# Patient Record
Sex: Female | Born: 1950 | Race: White | Hispanic: No | Smoking: Current every day smoker
Health system: Southern US, Community
[De-identification: ages and names within clinical notes are randomized; demographics above are authoritative.]

## PROBLEM LIST (undated history)

## (undated) DIAGNOSIS — I4891 Unspecified atrial fibrillation: Secondary | ICD-10-CM

## (undated) DIAGNOSIS — E119 Type 2 diabetes mellitus without complications: Secondary | ICD-10-CM

## (undated) DIAGNOSIS — I10 Essential (primary) hypertension: Secondary | ICD-10-CM

## (undated) DIAGNOSIS — I639 Cerebral infarction, unspecified: Secondary | ICD-10-CM

## (undated) DIAGNOSIS — C801 Malignant (primary) neoplasm, unspecified: Secondary | ICD-10-CM

## (undated) DIAGNOSIS — Z853 Personal history of malignant neoplasm of breast: Secondary | ICD-10-CM

## (undated) DIAGNOSIS — E785 Hyperlipidemia, unspecified: Secondary | ICD-10-CM

## (undated) HISTORY — DX: Essential (primary) hypertension: I10

## (undated) HISTORY — DX: Type 2 diabetes mellitus without complications: E11.9

## (undated) HISTORY — PX: BREAST SURGERY: SHX581

## (undated) HISTORY — DX: Unspecified atrial fibrillation: I48.91

## (undated) HISTORY — DX: Cerebral infarction, unspecified: I63.9

## (undated) HISTORY — DX: Personal history of malignant neoplasm of breast: Z85.3

## (undated) HISTORY — DX: Hyperlipidemia, unspecified: E78.5

## (undated) HISTORY — PX: SMALL INTESTINE SURGERY: SHX150

## (undated) HISTORY — DX: Malignant (primary) neoplasm, unspecified: C80.1

## (undated) HISTORY — PX: HERNIA REPAIR: SHX51

---

## 2017-03-09 DIAGNOSIS — I10 Essential (primary) hypertension: Secondary | ICD-10-CM | POA: Insufficient documentation

## 2017-03-09 DIAGNOSIS — E119 Type 2 diabetes mellitus without complications: Secondary | ICD-10-CM | POA: Insufficient documentation

## 2017-03-09 DIAGNOSIS — I629 Nontraumatic intracranial hemorrhage, unspecified: Secondary | ICD-10-CM | POA: Insufficient documentation

## 2019-09-19 LAB — HM MAMMOGRAPHY

## 2019-11-19 DIAGNOSIS — E860 Dehydration: Secondary | ICD-10-CM | POA: Insufficient documentation

## 2019-12-11 LAB — HEMOGLOBIN A1C: Hemoglobin A1C: 5.7

## 2019-12-12 DIAGNOSIS — I480 Paroxysmal atrial fibrillation: Secondary | ICD-10-CM | POA: Insufficient documentation

## 2020-01-06 LAB — HM DEXA SCAN: HM Dexa Scan: NORMAL

## 2020-02-10 HISTORY — PX: BILATERAL TOTAL MASTECTOMY WITH AXILLARY LYMPH NODE DISSECTION: SHX6364

## 2020-03-04 DIAGNOSIS — Z8616 Personal history of COVID-19: Secondary | ICD-10-CM | POA: Insufficient documentation

## 2020-03-12 DIAGNOSIS — Z9013 Acquired absence of bilateral breasts and nipples: Secondary | ICD-10-CM | POA: Insufficient documentation

## 2020-03-30 LAB — BASIC METABOLIC PANEL
BUN: 14 (ref 4–21)
CO2: 28 — AB (ref 13–22)
Chloride: 105 (ref 99–108)
Creatinine: 0.8 (ref 0.5–1.1)
Glucose: 153
Potassium: 4.1 (ref 3.4–5.3)
Sodium: 141 (ref 137–147)

## 2020-03-30 LAB — HEPATIC FUNCTION PANEL
ALT: 14 (ref 7–35)
AST: 15 (ref 13–35)
Alkaline Phosphatase: 69 (ref 25–125)
Bilirubin, Total: 0.2

## 2020-03-30 LAB — CBC AND DIFFERENTIAL
HCT: 35 — AB (ref 36–46)
Hemoglobin: 12.2 (ref 12.0–16.0)
Neutrophils Absolute: 3.6
Platelets: 223 (ref 150–399)
WBC: 5.6

## 2020-03-30 LAB — COMPREHENSIVE METABOLIC PANEL
Albumin: 3.8 (ref 3.5–5.0)
Calcium: 9.3 (ref 8.7–10.7)

## 2020-03-30 LAB — CBC: RBC: 3.81 — AB (ref 3.87–5.11)

## 2020-04-02 ENCOUNTER — Other Ambulatory Visit: Payer: Self-pay

## 2020-04-02 ENCOUNTER — Encounter: Payer: Self-pay | Admitting: Family Medicine

## 2020-04-02 ENCOUNTER — Ambulatory Visit (INDEPENDENT_AMBULATORY_CARE_PROVIDER_SITE_OTHER): Payer: Medicare Other | Admitting: Family Medicine

## 2020-04-02 DIAGNOSIS — C50919 Malignant neoplasm of unspecified site of unspecified female breast: Secondary | ICD-10-CM

## 2020-04-02 DIAGNOSIS — J40 Bronchitis, not specified as acute or chronic: Secondary | ICD-10-CM

## 2020-04-02 DIAGNOSIS — E119 Type 2 diabetes mellitus without complications: Secondary | ICD-10-CM | POA: Diagnosis not present

## 2020-04-02 DIAGNOSIS — I10 Essential (primary) hypertension: Secondary | ICD-10-CM | POA: Diagnosis not present

## 2020-04-02 DIAGNOSIS — E785 Hyperlipidemia, unspecified: Secondary | ICD-10-CM

## 2020-04-02 DIAGNOSIS — I480 Paroxysmal atrial fibrillation: Secondary | ICD-10-CM | POA: Diagnosis not present

## 2020-04-02 DIAGNOSIS — E1169 Type 2 diabetes mellitus with other specified complication: Secondary | ICD-10-CM

## 2020-04-02 LAB — PROTEIN, TOTAL: Total Protein: 6.4 (ref 6.4–8.2)

## 2020-04-02 MED ORDER — AZITHROMYCIN 250 MG PO TABS: ORAL_TABLET | ORAL | 0 refills | Status: DC

## 2020-04-02 MED ORDER — LISINOPRIL 10 MG PO TABS: 10.0000 mg | ORAL_TABLET | Freq: Every day | ORAL | 2 refills | Status: DC

## 2020-04-02 NOTE — Patient Instructions (Signed)
Great to see you today! Go ahead and start antibiotic.  See me again in 3 months.

## 2020-04-04 DIAGNOSIS — J40 Bronchitis, not specified as acute or chronic: Secondary | ICD-10-CM | POA: Insufficient documentation

## 2020-04-04 DIAGNOSIS — C50919 Malignant neoplasm of unspecified site of unspecified female breast: Secondary | ICD-10-CM | POA: Insufficient documentation

## 2020-04-04 DIAGNOSIS — E1169 Type 2 diabetes mellitus with other specified complication: Secondary | ICD-10-CM | POA: Insufficient documentation

## 2020-04-04 DIAGNOSIS — J209 Acute bronchitis, unspecified: Secondary | ICD-10-CM | POA: Insufficient documentation

## 2020-04-04 NOTE — Assessment & Plan Note (Addendum)
BP is well controlled at this time, continue lisinopril at current strength. Recommend low sodium diet as well.

## 2020-04-04 NOTE — Progress Notes (Signed)
Jasmine Montgomery - 70 y.o. female MRN [id number]  Date of birth: 11-02-1950  Subjective Chief Complaint  Patient presents with  . Establish Care    HPI Jasmine Montgomery is 70 y.o. female here today for initial visit to establish care.  She has a history of HTN, PAF, T2DM, HLD and breast cancer.    HTN is currently managed with lisinopril 10mg .  She is doing well with this and denies side effects related to this.  She has not had chest pain, shortness of breath, palpitations, headache or vision changes.   She did have brief episode of a. Fib when just prior to having port placed.  She has not had any further episodes or symptoms related to this.  She is seeing cardiology through [facility]  Diabetes is managed with metformin 1000mg  daily.  She is doing well with this.  A1c in 12/21 wast 5.7%.  She is taking simvastatin as well and tolerating well.    She is receiving treatment for breast cancer through oncologist at [facility].  She is s/p bilateral mastectomy with neoadjuvant chemotherapy prior to this.   She has had some chest congestion with some sinus congestion.  Productive cough without shortness of breath. Symptoms started about 1 week go.   ROS:  A comprehensive ROS was completed and negative except as noted per HPI    Past Medical History:  Diagnosis Date  . A-fib ([place])   . Diabetes mellitus without complication ([place])   . History of breast cancer   . Hyperlipidemia   . Hypertension   . Stroke [facility])     History reviewed. No pertinent surgical history.  Social History   Socioeconomic History  . Marital status: Widowed    Spouse name: Not on file  . Number of children: Not on file  . Years of education: Not on file  . Highest education level: Not on file  Occupational History  . Occupation: Retired  Tobacco Use  . Smoking status: Current Every Day Smoker    Packs/day: 0.50    Types: Cigarettes  . Smokeless tobacco: Never Used  Vaping Use  . Vaping Use: Never  used  Substance and Sexual Activity  . Alcohol use: Never  . Drug use: Never  . Sexual activity: Never    Partners: Female, Female  Other Topics Concern  . Not on file  Social History Narrative  . Not on file   Social Determinants of Health   Financial Resource Strain: Not on file  Food Insecurity: Not on file  Transportation Needs: Not on file  Physical Activity: Not on file  Stress: Not on file  Social Connections: Not on file    Family History  Problem Relation Age of Onset  . Breast cancer Sister   . Diabetes Brother     Health Maintenance  Topic Date Due  . Hepatitis C Screening  Never done  . FOOT EXAM  Never done  . OPHTHALMOLOGY EXAM  Never done  . COVID-19 Vaccine (3 - Pfizer risk 4-dose series) 11/26/2019  . INFLUENZA VACCINE  09/09/2020 (Originally 08/10/2019)  . COLONOSCOPY (Pts 45-25yrs Insurance coverage will need to be confirmed)  04/02/2021 (Originally 04/05/1997)  . PNA vac Low Risk Adult (1 of 2 - PCV13) 04/02/2021 (Originally 04/05/2017)  . HEMOGLOBIN A1C  06/10/2020  . MAMMOGRAM  03/31/2022  . TETANUS/TDAP  09/30/2025  . DEXA SCAN  Completed  . HPV VACCINES  Aged Out     ----------------------------------------------------------------------------------------------------------------------------------------------------------------------------------------------------------------- Physical Exam BP (!) 131/53 (  BP Location: Left Arm, Patient Position: Sitting, Cuff Size: Large)   Pulse 83   Temp 97.6 F (36.4 C)   Ht 5' 3.98" (1.625 m)   Wt 200 lb 14.4 oz (91.1 kg)   SpO2 96%   BMI 34.51 kg/m   Physical Exam Constitutional:      Appearance: Normal appearance.  HENT:     Head: Normocephalic and atraumatic.  Eyes:     General: No scleral icterus. Cardiovascular:     Rate and Rhythm: Normal rate and regular rhythm.  Pulmonary:     Effort: Pulmonary effort is normal.     Breath sounds: Normal breath sounds.  Musculoskeletal:     Cervical back:  Neck supple.  Neurological:     General: No focal deficit present.     Mental Status: She is alert.  Psychiatric:        Mood and Affect: Mood normal.        Behavior: Behavior normal.     ------------------------------------------------------------------------------------------------------------------------------------------------------------------------------------------------------------------- Assessment and Plan  Essential hypertension BP is well controlled at this time, continue lisinopril at current strength. Recommend low sodium diet as well.   Paroxysmal atrial fibrillation (HCC) Solitary episode prior to having port placed.  No symptoms and no further episodes since.  She will continue to see cardiology through [facility]    Type 2 diabetes mellitus without complication, without long-term current use of insulin ([place]) Diabetes is well controlled with metformin.  We'll plan to recheck this again at next f/u.    Breast cancer [facility]) S/p neoadjuvant chemo with recent mastectomy.  Healing well and will continue care through [facility].   Bronchitis Symptoms present for a week without improvement.  Will add azithromycin, zpack dosing.   Hyperlipidemia associated with type 2 diabetes mellitus ([place]) Tolerating simvastatin well.  She will continue and we'll update lipid panel at next follow up.     Meds ordered this encounter  Medications  . azithromycin (ZITHROMAX) 250 MG tablet    Sig: Take 2 tabs on day 1 followed by 1 tab on days 2-5    Dispense:  6 tablet    Refill:  0  . lisinopril (ZESTRIL) 10 MG tablet    Sig: Take 1 tablet (10 mg total) by mouth daily.    Dispense:  90 tablet    Refill:  2    Return in about 3 months (around 07/03/2020) for HTN/T2DM.    This visit occurred during the SARS-CoV-2 public health emergency.  Safety protocols were in place, including screening questions prior to the visit, additional usage of staff PPE, and extensive cleaning of  exam room while observing appropriate contact time as indicated for disinfecting solutions.

## 2020-04-04 NOTE — Assessment & Plan Note (Signed)
Tolerating simvastatin well.  She will continue and we'll update lipid panel at next follow up.

## 2020-04-04 NOTE — Assessment & Plan Note (Signed)
Solitary episode prior to having port placed.  No symptoms and no further episodes since.  She will continue to see cardiology through [facility].

## 2020-04-04 NOTE — Assessment & Plan Note (Signed)
Symptoms present for a week without improvement.  Will add azithromycin, zpack dosing.

## 2020-04-04 NOTE — Assessment & Plan Note (Signed)
S/p neoadjuvant chemo with recent mastectomy.  Healing well and will continue care through [facility].

## 2020-04-04 NOTE — Assessment & Plan Note (Signed)
Diabetes is well controlled with metformin.  We'll plan to recheck this again at next f/u.

## 2020-04-06 ENCOUNTER — Other Ambulatory Visit: Payer: Self-pay

## 2020-04-06 MED ORDER — SIMVASTATIN 20 MG PO TABS
20.0000 mg | ORAL_TABLET | Freq: Every day | ORAL | 1 refills | Status: DC
Start: 2020-04-06 — End: 2020-10-14

## 2020-05-05 ENCOUNTER — Telehealth: Payer: Self-pay

## 2020-05-05 NOTE — Telephone Encounter (Signed)
Transition Care Management Follow-up Telephone Call  Date of discharge and from where: 05/04/2020 from [facility]  How have you been since you were released from the hospital? Pt stated that she is feeling so much better today.   Any questions or concerns? No  Items Reviewed:  Did the pt receive and understand the discharge instructions provided? Yes   Medications obtained and verified? Yes   Other? No   Any new allergies since your discharge? No   Dietary orders reviewed? n/a  Do you have support at home? Yes   Functional Questionnaire: (I = Independent and D = Dependent) ADLs: I  Bathing/Dressing- I  Meal Prep- I  Eating- I  Maintaining continence- I  Transferring/Ambulation- I  Managing Meds- I   Follow up appointments reviewed:   PCP Hospital f/u appt confirmed? No    Specialist Hospital f/u appt confirmed? No    Are transportation arrangements needed? No   If their condition worsens, is the pt aware to call PCP or go to the Emergency Dept.? Yes  Was the patient provided with contact information for the PCP's office or ED? Yes  Was to pt encouraged to call back with questions or concerns? Yes

## 2020-06-09 ENCOUNTER — Other Ambulatory Visit: Payer: Self-pay | Admitting: *Deleted

## 2020-06-09 MED ORDER — ALBUTEROL SULFATE HFA 108 (90 BASE) MCG/ACT IN AERS: 1.0000 | INHALATION_SPRAY | RESPIRATORY_TRACT | 2 refills | Status: DC | PRN

## 2020-07-05 ENCOUNTER — Encounter: Payer: Self-pay | Admitting: Family Medicine

## 2020-07-05 ENCOUNTER — Ambulatory Visit (INDEPENDENT_AMBULATORY_CARE_PROVIDER_SITE_OTHER): Payer: Medicare Other | Admitting: Family Medicine

## 2020-07-05 ENCOUNTER — Other Ambulatory Visit: Payer: Self-pay

## 2020-07-05 ENCOUNTER — Ambulatory Visit (INDEPENDENT_AMBULATORY_CARE_PROVIDER_SITE_OTHER): Payer: Medicare Other

## 2020-07-05 VITALS — BP 142/59 | HR 103 | Temp 97.7°F | Wt 195.0 lb

## 2020-07-05 DIAGNOSIS — I10 Essential (primary) hypertension: Secondary | ICD-10-CM

## 2020-07-05 DIAGNOSIS — K5909 Other constipation: Secondary | ICD-10-CM

## 2020-07-05 DIAGNOSIS — E119 Type 2 diabetes mellitus without complications: Secondary | ICD-10-CM

## 2020-07-05 DIAGNOSIS — R109 Unspecified abdominal pain: Secondary | ICD-10-CM | POA: Diagnosis not present

## 2020-07-05 DIAGNOSIS — E1169 Type 2 diabetes mellitus with other specified complication: Secondary | ICD-10-CM

## 2020-07-05 DIAGNOSIS — E785 Hyperlipidemia, unspecified: Secondary | ICD-10-CM

## 2020-07-05 DIAGNOSIS — K5900 Constipation, unspecified: Secondary | ICD-10-CM | POA: Insufficient documentation

## 2020-07-05 LAB — POCT GLYCOSYLATED HEMOGLOBIN (HGB A1C): HbA1c, POC (controlled diabetic range): 6.3 % (ref 0.0–7.0)

## 2020-07-05 NOTE — Assessment & Plan Note (Signed)
Tolerating simvastatin well, continue at current strength

## 2020-07-05 NOTE — Assessment & Plan Note (Signed)
Blood pressure is at goal at for age and co-morbidities.  I recommend continuation of lisinopril.  In addition they were instructed to follow a low sodium diet with regular exercise to help to maintain adequate control of blood pressure.

## 2020-07-05 NOTE — Assessment & Plan Note (Signed)
Most recent A1c of  Lab Results  Component Value Date   HGBA1C 6.3 07/05/2020   indicates diabetes is well controlled.  she will continue metformin at current strength.  Counseled on healthy, low carb diet and recommend frequent activity to help with maintaining good control of blood sugars.

## 2020-07-05 NOTE — Assessment & Plan Note (Signed)
Recommend increased fluid intake with addition of miralax or fiber suppleent.  KUB ordered as well to evaluate for possible obstruction.

## 2020-07-05 NOTE — Patient Instructions (Signed)
Start miralax daily 1-2 capfuls daily as needed for soft bowel movement.  Be sure to drink plenty of water.  Have xray completed downstairs.

## 2020-07-05 NOTE — Progress Notes (Signed)
Jasmine Montgomery - 70 y.o. female MRN [id number]  Date of birth: 1950/02/02  Subjective Chief Complaint  Patient presents with   Diabetes   Hypertension    HPI Jasmine Montgomery is a 70 y.o. female here today for follow up visit.    She continues with chemotherapy for treatment of her breast cancer.  She was also recently started on letrozole.  She has had some increased constipation recently.  She has tried dulcolax as needed.   She does have some mid abdominal pain.  She denies nausea or decreased appetite.   She continues on metformin for treatment of diabetes.  She does not check blood sugar at home.  She denies symptoms related to diabetes. She is tolerating simvastatin well for associated HLD  HTN remains well controlled with lisinopril.  She denies chest pain,shortness of breath,palpitations, headache or vision changes.   ROS:  A comprehensive ROS was completed and negative except as noted per HPI  No Known Allergies  Past Medical History:  Diagnosis Date   A-fib ([place])    Diabetes mellitus without complication ([place])    History of breast cancer    Hyperlipidemia    Hypertension    Stroke [facility])     History reviewed. No pertinent surgical history.  Social History   Socioeconomic History   Marital status: Widowed    Spouse name: Not on file   Number of children: Not on file   Years of education: Not on file   Highest education level: Not on file  Occupational History   Occupation: Retired  Tobacco Use   Smoking status: Every Day    Packs/day: 0.50    Pack years: 0.00    Types: Cigarettes   Smokeless tobacco: Never  Vaping Use   Vaping Use: Never used  Substance and Sexual Activity   Alcohol use: Never   Drug use: Never   Sexual activity: Never    Partners: Female, Female  Other Topics Concern   Not on file  Social History Narrative   Not on file   Social Determinants of Health   Financial Resource Strain: Not on file  Food Insecurity: Not on file   Transportation Needs: Not on file  Physical Activity: Not on file  Stress: Not on file  Social Connections: Not on file    Family History  Problem Relation Age of Onset   Breast cancer Sister    Diabetes Brother     Health Maintenance  Topic Date Due   FOOT EXAM  Never done   OPHTHALMOLOGY EXAM  Never done   URINE MICROALBUMIN  Never done   Hepatitis C Screening  Never done   Zoster Vaccines- Shingrix (1 of 2) Never done   COVID-19 Vaccine (3 - Pfizer risk series) 11/26/2019   COLONOSCOPY (Pts 45-67yrs Insurance coverage will need to be confirmed)  04/02/2021 (Originally 04/05/1997)   PNA vac Low Risk Adult (1 of 2 - PCV13) 04/02/2021 (Originally 04/05/2017)   INFLUENZA VACCINE  08/09/2020   HEMOGLOBIN A1C  01/04/2021   MAMMOGRAM  09/18/2021   TETANUS/TDAP  09/30/2025   DEXA SCAN  Completed   HPV VACCINES  Aged Out     ----------------------------------------------------------------------------------------------------------------------------------------------------------------------------------------------------------------- Physical Exam BP (!) 142/59 (BP Location: Left Arm, Patient Position: Sitting, Cuff Size: Large)   Pulse (!) 103   Temp 97.7 F (36.5 C)   Wt 195 lb (88.5 kg)   SpO2 97%   BMI 33.50 kg/m   Physical Exam Constitutional:      Appearance:  Normal appearance.  HENT:     Head: Normocephalic and atraumatic.  Eyes:     General: No scleral icterus. Cardiovascular:     Rate and Rhythm: Normal rate and regular rhythm.  Pulmonary:     Effort: Pulmonary effort is normal.     Breath sounds: Normal breath sounds.  Abdominal:     Comments: Mild distention with ttp around the mid abdomen.  Musculoskeletal:     Cervical back: Neck supple.  Skin:    General: Skin is warm and dry.  Neurological:     General: No focal deficit present.     Mental Status: She is alert.     ------------------------------------------------------------------------------------------------------------------------------------------------------------------------------------------------------------------- Assessment and Plan  Type 2 diabetes mellitus without complication, without long-term current use of insulin ([place]) Most recent A1c of  Lab Results  Component Value Date   HGBA1C 6.3 07/05/2020   indicates diabetes is well controlled.  she will continue metformin at current strength.  Counseled on healthy, low carb diet and recommend frequent activity to help with maintaining good control of blood sugars.    Hyperlipidemia associated with type 2 diabetes mellitus ([place]) Tolerating simvastatin well, continue at current strength  Essential hypertension Blood pressure is at goal at for age and co-morbidities.  I recommend continuation of lisinopril.  In addition they were instructed to follow a low sodium diet with regular exercise to help to maintain adequate control of blood pressure.    Constipation Recommend increased fluid intake with addition of miralax or fiber suppleent.  KUB ordered as well to evaluate for possible obstruction.     No orders of the defined types were placed in this encounter.   No follow-ups on file.    This visit occurred during the SARS-CoV-2 public health emergency.  Safety protocols were in place, including screening questions prior to the visit, additional usage of staff PPE, and extensive cleaning of exam room while observing appropriate contact time as indicated for disinfecting solutions.

## 2020-08-25 ENCOUNTER — Emergency Department (INDEPENDENT_AMBULATORY_CARE_PROVIDER_SITE_OTHER)
Admission: RE | Admit: 2020-08-25 | Discharge: 2020-08-25 | Disposition: A | Discharge status: Home / Self Care | Payer: Medicare Other | Source: Ambulatory Visit

## 2020-08-25 ENCOUNTER — Telehealth: Payer: Self-pay

## 2020-08-25 ENCOUNTER — Other Ambulatory Visit: Payer: Self-pay

## 2020-08-25 VITALS — BP 122/81 | HR 100 | Temp 99.0°F | Resp 16 | Ht 65.5 in | Wt 194.0 lb

## 2020-08-25 DIAGNOSIS — K5900 Constipation, unspecified: Secondary | ICD-10-CM

## 2020-08-25 MED ORDER — MAGNESIUM CITRATE PO SOLN: ORAL | 1 refills | Status: DC

## 2020-08-25 NOTE — Discharge Instructions (Addendum)
Advised patient to take medication as directed to completion.  Encouraged patient to increase daily water intake while taking this medication.

## 2020-08-25 NOTE — Telephone Encounter (Signed)
Pt called and stated Mag citrate was not available at her pharmacy, alternate pharmacy contacted by this nurse and Mag Citrate continues to be on back order. Eliezer Lofts FNP notified and recommended pt be evaluated in the ED for symptoms. Pt verbalized understanding.

## 2020-08-25 NOTE — ED Provider Notes (Signed)
[facility]    CSN: [id number] Arrival date & time: 08/25/20  1402      History   Chief Complaint Chief Complaint  Patient presents with   Constipation    HPI Jasmine Montgomery is a 70 y.o. female.   HPI Pleasant 70 year old female presents with constipation and reports not having a bowel movement since Saturday, 08/21/2020.  Reports is recently have a change in her chemo (s/p breast cancer, s/p bilateral mastectomy) recently is coming up on her seventh chemo treatment out of 14 and will switch over to oral chemo after 14th treatment.  Patient reports has been taking Zofran for nausea.  Past Medical History:  Diagnosis Date   A-fib [facility])    Diabetes mellitus without complication ([place])    History of breast cancer    Hyperlipidemia    Hypertension    Stroke [facility])     Patient Active Problem List   Diagnosis Date Noted   Constipation 07/05/2020   Breast cancer ([place]) 04/04/2020   Bronchitis 04/04/2020   Hyperlipidemia associated with type 2 diabetes mellitus ([place]) 04/04/2020   S/P bilateral mastectomy 03/12/2020   History of COVID-19 03/04/2020   Paroxysmal atrial fibrillation ([place]) 12/12/2019   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 No pertinent surgical history.  OB History   No obstetric history on file.      Home Medications    Prior to Admission medications   Medication Sig Start Date End Date Taking? Authorizing Provider  magnesium citrate SOLN Take 150 mL p.o. twice daily for 3 days. 08/25/20  Yes Eliezer Lofts, FNP  albuterol (VENTOLIN HFA) 108 (90 Base) MCG/ACT inhaler Inhale 1-2 puffs into the lungs every 4 (four) hours as needed for wheezing or shortness of breath. 06/09/20   Luetta Nutting, DO  Ascorbic Acid (VITAMIN C PO) Take by  mouth. Patient not taking: Reported on 08/25/2020    [provider]  Cholecalciferol 25 MCG (1000 UT) tablet Take by mouth.    [provider]  letrozole (FEMARA) 2.5 MG tablet Take 2.5 mg by mouth daily. 06/02/20   [provider]  lisinopril (ZESTRIL) 10 MG tablet Take 1 tablet (10 mg total) by mouth daily. 04/02/20 07/01/20  Luetta Nutting, DO  metFORMIN (GLUCOPHAGE) 1000 MG tablet Take by mouth. 07/18/19   [provider]  simvastatin (ZOCOR) 20 MG tablet Take 1 tablet (20 mg total) by mouth daily. 04/06/20 07/05/20  Luetta Nutting, DO  valACYclovir (VALTREX) 1000 MG tablet Take 1,000 mg by mouth 3 (three) times daily. Patient not taking: Reported on 08/25/2020 02/05/20   [provider]  VITAMIN A PO Take by mouth.    [provider]  vitamin B-12 (CYANOCOBALAMIN) 500 MCG tablet Take by mouth.    [provider]  VITAMIN E PO Take by mouth.    [provider]    Family History Family History  Problem Relation Age of Onset   Breast cancer Sister    Diabetes Brother     Social History Social History   Tobacco Use   Smoking status: Every Day    Packs/day: 0.50    Types: Cigarettes   Smokeless tobacco: Never  Vaping Use   Vaping Use: Never used  Substance Use Topics   Alcohol use: Never   Drug use: Never  Allergies   Patient has no known allergies.   Review of Systems Review of Systems  Gastrointestinal:  Positive for constipation.  All other systems reviewed and are negative.   Physical Exam Triage Vital Signs ED Triage Vitals  Enc Vitals Group     BP 08/25/20 1428 122/81     Pulse Rate 08/25/20 1428 100     Resp 08/25/20 1428 16     Temp 08/25/20 1428 99 F (37.2 C)     Temp Source 08/25/20 1428 Oral     SpO2 08/25/20 1428 96 %     Weight 08/25/20 1430 194 lb (88 kg)     Height 08/25/20 1430 5' 5.5" (1.664 m)     Head Circumference --      Peak Flow --      Pain Score 08/25/20 1430 4      Pain Loc --      Pain Edu? --      Excl. in [place]? --    No data found.  Updated Vital Signs BP 122/81   Pulse 100   Temp 99 F (37.2 C) (Oral)   Resp 16   Ht 5' 5.5" (1.664 m)   Wt 194 lb (88 kg)   SpO2 96%   BMI 31.79 kg/m     Physical Exam Vitals and nursing note reviewed.  Constitutional:      General: She is not in acute distress.    Appearance: Normal appearance. She is obese. She is ill-appearing. She is not toxic-appearing or diaphoretic.  HENT:     Head: Normocephalic and atraumatic.     Nose: Nose normal.     Mouth/Throat:     Mouth: Mucous membranes are moist.     Pharynx: Oropharynx is clear.  Eyes:     Extraocular Movements: Extraocular movements intact.     Conjunctiva/sclera: Conjunctivae normal.     Pupils: Pupils are equal, round, and reactive to light.  Cardiovascular:     Rate and Rhythm: Normal rate and regular rhythm.     Pulses: Normal pulses.     Heart sounds: Normal heart sounds. No murmur heard.   No friction rub. No gallop.  Pulmonary:     Effort: Pulmonary effort is normal.     Breath sounds: Normal breath sounds. No stridor. No wheezing, rhonchi or rales.  Abdominal:     General: There is distension.     Palpations: Abdomen is soft. There is no mass.     Tenderness: There is abdominal tenderness. There is no right CVA tenderness, left CVA tenderness or guarding.     Hernia: No hernia is present.     Comments: Hypoactive bowel sounds x4 quadrants, TTP over LLQ/RLQ, no hepatosplenomegaly  Musculoskeletal:        General: Normal range of motion.     Cervical back: Normal range of motion and neck supple. No tenderness.  Lymphadenopathy:     Cervical: No cervical adenopathy.  Skin:    General: Skin is warm and dry.  Neurological:     General: No focal deficit present.     Mental Status: She is alert and oriented to person, place, and time. Mental status is at baseline.  Psychiatric:        Mood and Affect: Mood normal.        Behavior:  Behavior normal.        Thought Content: Thought content normal.     UC Treatments / Results  Labs (all labs ordered are  listed, but only abnormal results are displayed) Labs Reviewed - No data to display  EKG   Radiology No results found.  Procedures Procedures (including critical care time)  Medications Ordered in UC Medications - No data to display  Initial Impression / Assessment and Plan / UC Course  I have reviewed the triage vital signs and the nursing notes.  Pertinent labs & imaging results that were available during my care of the patient were reviewed by me and considered in my medical decision making (see chart for details).    MDM: 1.  Constipation-Rx'd Mag citrate. Advised patient to take medication as directed to completion.  Encouraged patient to increase daily water intake while taking this medication.  Patient discharged home, hemodynamically stable. Final Clinical Impressions(s) / UC Diagnoses   Final diagnoses:  Constipation, unspecified constipation type     Discharge Instructions      Advised patient to take medication as directed to completion.  Encouraged patient to increase daily water intake while taking this medication.      ED Prescriptions     Medication Sig Dispense Auth. Provider   magnesium citrate SOLN Take 150 mL p.o. twice daily for 3 days. 900 mL Eliezer Lofts, FNP      PDMP not reviewed this encounter.   Eliezer Lofts, [place] 08/25/20 1450

## 2020-08-25 NOTE — ED Triage Notes (Signed)
T presents with constipation associated with a change in her chemo medication. Pt states she has been unable to eat since Saturday and beginning Sunday has had began with abdominal pain and bloating. Pt has been taking miralax without any change in condition. Pt states she is also having emesis and is taking Zofran as needed.

## 2020-09-23 ENCOUNTER — Encounter: Payer: Self-pay | Admitting: Family Medicine

## 2020-09-23 ENCOUNTER — Ambulatory Visit (INDEPENDENT_AMBULATORY_CARE_PROVIDER_SITE_OTHER): Payer: Medicare Other | Admitting: Family Medicine

## 2020-09-23 VITALS — BP 139/58 | HR 94 | Temp 98.1°F | Ht 65.5 in | Wt 196.1 lb

## 2020-09-23 DIAGNOSIS — K5900 Constipation, unspecified: Secondary | ICD-10-CM

## 2020-09-23 DIAGNOSIS — R109 Unspecified abdominal pain: Secondary | ICD-10-CM

## 2020-09-23 DIAGNOSIS — Z23 Encounter for immunization: Secondary | ICD-10-CM

## 2020-09-23 MED ORDER — CLOTRIMAZOLE-BETAMETHASONE 1-0.05 % EX CREA: 1.0000 "application " | TOPICAL_CREAM | Freq: Two times a day (BID) | CUTANEOUS | 1 refills | Status: DC

## 2020-09-23 NOTE — Assessment & Plan Note (Signed)
She continues to have problems with constipation however current bowel regimen is working pretty well for her.  She will continue this.  She is having some increased pain with bloating.  She does have history of complications related to hernia mesh.  We discussed getting an abdominal ultrasound for initial evaluation.  We can consider CT scan if this is inconclusive.

## 2020-09-23 NOTE — Progress Notes (Signed)
Jasmine Montgomery - 70 y.o. female MRN [id number]  Date of birth: 21-Apr-1950  Subjective Chief Complaint  Patient presents with   Abdominal Pain    HPI Jasmine Montgomery is a 70 year old female here today with complaint of abdominal pain.  She also has questions about disability process.  She has had pain in her abdomen.  She has had this for a couple weeks.  She has had associated bloating sensation.  She has had some nausea and constipation at times.  She is on a fairly good bowel regimen to help with her constipation.  This seems to worsen after femara.  She did have previous problems with mesh related to hernia repair and had partial small bowel resection.  She and her daughter have questions regarding possible disability.  Recommended that she look into disability through Social Security if interested in starting process for this.  ROS:  A comprehensive ROS was completed and negative except as noted per HPI  No Known Allergies  Past Medical History:  Diagnosis Date   A-fib ([place])    Diabetes mellitus without complication ([place])    History of breast cancer    Hyperlipidemia    Hypertension    Stroke ([place])     No past surgical history on file.  Social History   Socioeconomic History   Marital status: Widowed    Spouse name: Not on file   Number of children: Not on file   Years of education: Not on file   Highest education level: Not on file  Occupational History   Occupation: Retired  Tobacco Use   Smoking status: Every Day    Packs/day: 0.50    Types: Cigarettes   Smokeless tobacco: Never  Vaping Use   Vaping Use: Never used  Substance and Sexual Activity   Alcohol use: Never   Drug use: Never   Sexual activity: Never    Partners: Female, Female  Other Topics Concern   Not on file  Social History Narrative   Not on file   Social Determinants of Health   Financial Resource Strain: Not on file  Food Insecurity: Not on file  Transportation Needs: Not on file   Physical Activity: Not on file  Stress: Not on file  Social Connections: Not on file    Family History  Problem Relation Age of Onset   Breast cancer Sister    Diabetes Brother     Health Maintenance  Topic Date Due   FOOT EXAM  Never done   OPHTHALMOLOGY EXAM  Never done   URINE MICROALBUMIN  Never done   Hepatitis C Screening  Never done   COVID-19 Vaccine (3 - Pfizer risk series) 11/26/2019   COLONOSCOPY (Pts 45-74yr Insurance coverage will need to be confirmed)  04/02/2021 (Originally 04/05/1997)   PNA vac Low Risk Adult (1 of 2 - PCV13) 04/02/2021 (Originally 04/05/2017)   Zoster Vaccines- Shingrix (2 of 2) 11/18/2020   HEMOGLOBIN A1C  01/04/2021   MAMMOGRAM  09/18/2021   TETANUS/TDAP  09/30/2025   DEXA SCAN  Completed   HPV VACCINES  Aged Out   INFLUENZA VACCINE  Discontinued     ----------------------------------------------------------------------------------------------------------------------------------------------------------------------------------------------------------------- Physical Exam BP (!) 139/58 (BP Location: Left Arm, Patient Position: Sitting, Cuff Size: Large)   Pulse 94   Temp 98.1 F (36.7 C)   Ht 5' 5.5" (1.664 m)   Wt 196 lb 1.6 oz (89 kg)   SpO2 96%   BMI 32.14 kg/m   Physical Exam Constitutional:  Appearance: She is well-developed.  HENT:     Head: Normocephalic and atraumatic.  Eyes:     General: No scleral icterus. Cardiovascular:     Rate and Rhythm: Normal rate and regular rhythm.  Pulmonary:     Effort: Pulmonary effort is normal.     Breath sounds: Normal breath sounds.  Abdominal:     General: There is no distension.     Palpations: Abdomen is soft.     Tenderness: There is no abdominal tenderness.  Musculoskeletal:     Cervical back: Neck supple.  Neurological:     Mental Status: She is alert.  Psychiatric:        Mood and Affect: Mood normal.        Behavior: Behavior normal.     ------------------------------------------------------------------------------------------------------------------------------------------------------------------------------------------------------------------- Assessment and Plan  Constipation She continues to have problems with constipation however current bowel regimen is working pretty well for her.  She will continue this.  She is having some increased pain with bloating.  She does have history of complications related to hernia mesh.  We discussed getting an abdominal ultrasound for initial evaluation.  We can consider CT scan if this is inconclusive.     Meds ordered this encounter  Medications   clotrimazole-betamethasone (LOTRISONE) cream    Sig: Apply 1 application topically 2 (two) times daily.    Dispense:  30 g    Refill:  1    No follow-ups on file.    This visit occurred during the SARS-CoV-2 public health emergency.  Safety protocols were in place, including screening questions prior to the visit, additional usage of staff PPE, and extensive cleaning of exam room while observing appropriate contact time as indicated for disinfecting solutions.

## 2020-09-23 NOTE — Patient Instructions (Signed)
Try adding align pro-biotic.  We'll be in touch with [place] results.

## 2020-09-24 ENCOUNTER — Other Ambulatory Visit: Payer: Medicare Other

## 2020-09-27 ENCOUNTER — Other Ambulatory Visit: Payer: Self-pay

## 2020-09-27 ENCOUNTER — Ambulatory Visit (INDEPENDENT_AMBULATORY_CARE_PROVIDER_SITE_OTHER): Payer: Medicare Other

## 2020-09-27 DIAGNOSIS — R109 Unspecified abdominal pain: Secondary | ICD-10-CM | POA: Diagnosis not present

## 2020-10-01 ENCOUNTER — Other Ambulatory Visit: Payer: Self-pay | Admitting: Family Medicine

## 2020-10-01 DIAGNOSIS — R161 Splenomegaly, not elsewhere classified: Secondary | ICD-10-CM

## 2020-10-01 DIAGNOSIS — R109 Unspecified abdominal pain: Secondary | ICD-10-CM

## 2020-10-07 ENCOUNTER — Other Ambulatory Visit: Payer: Medicare Other

## 2020-10-11 ENCOUNTER — Other Ambulatory Visit: Payer: Self-pay

## 2020-10-11 ENCOUNTER — Ambulatory Visit (INDEPENDENT_AMBULATORY_CARE_PROVIDER_SITE_OTHER): Payer: Medicare Other

## 2020-10-11 DIAGNOSIS — R161 Splenomegaly, not elsewhere classified: Secondary | ICD-10-CM | POA: Diagnosis not present

## 2020-10-11 DIAGNOSIS — R109 Unspecified abdominal pain: Secondary | ICD-10-CM

## 2020-10-11 DIAGNOSIS — G8929 Other chronic pain: Secondary | ICD-10-CM | POA: Diagnosis not present

## 2020-10-11 MED ORDER — IOHEXOL 350 MG/ML SOLN
100.0000 mL | Freq: Once | INTRAVENOUS | Status: AC | PRN
  Administered 2020-10-11: 75 mL via INTRAVENOUS

## 2020-10-14 ENCOUNTER — Other Ambulatory Visit: Payer: Self-pay | Admitting: Family Medicine

## 2020-10-15 ENCOUNTER — Other Ambulatory Visit: Payer: Self-pay | Admitting: Family Medicine

## 2020-10-15 DIAGNOSIS — E278 Other specified disorders of adrenal gland: Secondary | ICD-10-CM

## 2020-10-15 DIAGNOSIS — D4412 Neoplasm of uncertain behavior of left adrenal gland: Secondary | ICD-10-CM

## 2020-10-18 ENCOUNTER — Ambulatory Visit (INDEPENDENT_AMBULATORY_CARE_PROVIDER_SITE_OTHER): Payer: Medicare Other

## 2020-10-18 ENCOUNTER — Other Ambulatory Visit: Payer: Self-pay | Admitting: Family Medicine

## 2020-10-18 ENCOUNTER — Other Ambulatory Visit: Payer: Self-pay

## 2020-10-18 DIAGNOSIS — E278 Other specified disorders of adrenal gland: Secondary | ICD-10-CM

## 2020-10-18 DIAGNOSIS — D4412 Neoplasm of uncertain behavior of left adrenal gland: Secondary | ICD-10-CM

## 2020-10-18 MED ORDER — IOHEXOL 350 MG/ML SOLN
100.0000 mL | Freq: Once | INTRAVENOUS | Status: AC | PRN
  Administered 2020-10-18: 82 mL via INTRAVENOUS

## 2020-11-04 ENCOUNTER — Other Ambulatory Visit: Payer: Self-pay

## 2020-11-04 DIAGNOSIS — E119 Type 2 diabetes mellitus without complications: Secondary | ICD-10-CM

## 2020-11-04 MED ORDER — METFORMIN HCL 1000 MG PO TABS: 1000.0000 mg | ORAL_TABLET | Freq: Every day | ORAL | 3 refills | Status: DC

## 2020-12-20 ENCOUNTER — Other Ambulatory Visit: Payer: Self-pay | Admitting: Family Medicine

## 2021-01-04 ENCOUNTER — Other Ambulatory Visit: Payer: Self-pay

## 2021-01-04 ENCOUNTER — Emergency Department (INDEPENDENT_AMBULATORY_CARE_PROVIDER_SITE_OTHER)
Admission: RE | Admit: 2021-01-04 | Discharge: 2021-01-04 | Disposition: A | Discharge status: Home / Self Care | Payer: Medicare Other | Source: Ambulatory Visit | Attending: Family Medicine | Admitting: Family Medicine

## 2021-01-04 VITALS — BP 118/76 | HR 95 | Temp 98.6°F | Resp 14

## 2021-01-04 DIAGNOSIS — J309 Allergic rhinitis, unspecified: Secondary | ICD-10-CM

## 2021-01-04 DIAGNOSIS — R059 Cough, unspecified: Secondary | ICD-10-CM | POA: Diagnosis not present

## 2021-01-04 MED ORDER — FEXOFENADINE HCL 180 MG PO TABS: 180.0000 mg | ORAL_TABLET | Freq: Every day | ORAL | 0 refills | Status: DC

## 2021-01-04 MED ORDER — CEFDINIR 300 MG PO CAPS: 300.0000 mg | ORAL_CAPSULE | Freq: Two times a day (BID) | ORAL | 0 refills | Status: AC

## 2021-01-04 MED ORDER — BENZONATATE 200 MG PO CAPS: 200.0000 mg | ORAL_CAPSULE | Freq: Three times a day (TID) | ORAL | 0 refills | Status: AC | PRN

## 2021-01-04 NOTE — ED Provider Notes (Signed)
[facility]    CSN: [id number] Arrival date & time: 01/04/21  1039      History   Chief Complaint Chief Complaint  Patient presents with   APPT 1100   Cough    HPI Jasmine Montgomery is a 70 y.o. female.   HPI Pleasant 70 year old female presents with cough for 2 weeks.  Reports completing last chemo session (s/p breast cancer, s/p bilateral mastectomy) on Thursday of last week (12/30/20).  PMH significant for Paroxysmal A. fib, HTN, and stroke.  Past Medical History:  Diagnosis Date   A-fib [facility])    Diabetes mellitus without complication ([place])    History of breast cancer    Hyperlipidemia    Hypertension    Stroke [facility])     Patient Active Problem List   Diagnosis Date Noted   Constipation 07/05/2020   Breast cancer ([place]) 04/04/2020   Bronchitis 04/04/2020   Hyperlipidemia associated with type 2 diabetes mellitus ([place]) 04/04/2020   S/P bilateral mastectomy 03/12/2020   History of COVID-19 03/04/2020   Paroxysmal atrial fibrillation ([place]) 12/12/2019   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 No pertinent surgical history.  OB History   No obstetric history on file.      Home Medications    Prior to Admission medications   Medication Sig Start Date End Date Taking? Authorizing Provider  benzonatate (TESSALON) 200 MG capsule Take 1 capsule (200 mg total) by mouth 3 (three) times daily as needed for up to 7 days for cough. 01/04/21 01/11/21 Yes Eliezer Lofts, FNP  cefdinir (OMNICEF) 300 MG capsule Take 1 capsule (300 mg total) by mouth 2 (two) times daily for 10 days. 01/04/21 01/14/21 Yes Eliezer Lofts, FNP  fexofenadine [facility] ALLERGY) 180 MG tablet Take 1 tablet (180 mg total) by mouth daily for 15 days. 01/04/21 01/19/21 Yes Eliezer Lofts, FNP   Ascorbic Acid (VITAMIN C PO) Take by mouth.    [provider]  Cholecalciferol 25 MCG (1000 UT) tablet Take by mouth.    [provider]  clotrimazole-betamethasone (LOTRISONE) cream Apply 1 application topically 2 (two) times daily. 09/23/20   Luetta Nutting, DO  letrozole [facility]) 2.5 MG tablet Take 2.5 mg by mouth daily. 06/02/20   [provider]  lidocaine-prilocaine (EMLA) cream Apply topically as needed. 08/18/20   [provider]  lisinopril (ZESTRIL) 10 MG tablet Take 1 tablet (10 mg total) by mouth daily. 04/02/20 07/01/20  Luetta Nutting, DO  magnesium citrate SOLN Take 150 mL p.o. twice daily for 3 days. 08/25/20   Eliezer Lofts, FNP  metFORMIN (GLUCOPHAGE) 1000 MG tablet Take 1 tablet (1,000 mg total) by mouth daily with breakfast. 11/04/20 02/02/21  Luetta Nutting, DO  ondansetron (ZOFRAN) 4 MG tablet Take 4 mg by mouth every 8 (eight) hours as needed. 08/23/20   [provider]  simvastatin (ZOCOR) 20 MG tablet Take 1 tablet by mouth once daily 10/14/20   Luetta Nutting, DO  valACYclovir (VALTREX) 1000 MG tablet Take 1,000 mg by mouth 3 (three) times daily. 02/05/20   [provider]  VENTOLIN HFA 108 (90 Base) MCG/ACT inhaler INHALE 1 TO 2 PUFFS INTO LUNGS EVERY 4 HOURS AS NEEDED FOR WHEEZING OR SHORTNESS OF BREATH 12/21/20   Luetta Nutting, DO  VITAMIN A PO Take by mouth.    [provider]  vitamin B-12 (CYANOCOBALAMIN) 500 MCG tablet Take by mouth.    [provider]  VITAMIN E PO Take by mouth.    [provider]    Family History Family History  Problem Relation Age of Onset   Breast cancer Sister    Diabetes Brother     Social History Social History   Tobacco Use   Smoking status: Every Day    Packs/day: 0.50    Types: Cigarettes   Smokeless tobacco: Never  Vaping Use   Vaping Use: Never used  Substance Use Topics   Alcohol use: Never   Drug use: Never     Allergies   Patient has  no known allergies.   Review of Systems Review of Systems  Respiratory:  Positive for cough.   All other systems reviewed and are negative.   Physical Exam Triage Vital Signs ED Triage Vitals  Enc Vitals Group     BP 01/04/21 1127 118/76     Pulse Rate 01/04/21 1127 95     Resp 01/04/21 1127 14     Temp 01/04/21 1127 98.6 F (37 C)     Temp Source 01/04/21 1127 Oral     SpO2 01/04/21 1127 95 %     Weight --      Height --      Head Circumference --      Peak Flow --      Pain Score 01/04/21 1128 0     Pain Loc --      Pain Edu? --      Excl. in [place]? --    No data found.  Updated Vital Signs BP 118/76 (BP Location: Left Arm)    Pulse 95    Temp 98.6 F (37 C) (Oral)    Resp 14    SpO2 95%   Physical Exam Vitals and nursing note reviewed.  Constitutional:      General: She is not in acute distress.    Appearance: Normal appearance. She is obese. She is not ill-appearing.  HENT:     Head: Normocephalic and atraumatic.     Right Ear: Tympanic membrane, ear canal and external ear normal.     Left Ear: Tympanic membrane, ear canal and external ear normal.     Mouth/Throat:     Mouth: Mucous membranes are moist.     Pharynx: Oropharynx is clear.     Comments: Moderate amount of clear drainage of posterior oropharynx noted Eyes:     Extraocular Movements: Extraocular movements intact.     Conjunctiva/sclera: Conjunctivae normal.     Pupils: Pupils are equal, round, and reactive to light.  Cardiovascular:     Rate and Rhythm: Normal rate and regular rhythm.     Pulses: Normal pulses.     Heart sounds: Normal heart sounds.  Pulmonary:     Effort: Pulmonary effort is normal.     Breath sounds: Normal breath sounds. No wheezing, rhonchi or rales.  Musculoskeletal:     Cervical back: No tenderness.  Lymphadenopathy:     Cervical: No cervical adenopathy.  Skin:    General: Skin is warm and dry.  Neurological:     General: No focal deficit present.     Mental  Status: She is alert and oriented to person, place, and time.     UC Treatments / Results  Labs (all labs ordered are listed, but only abnormal results are displayed) Labs Reviewed - No data to display  EKG  Radiology No results found.  Procedures Procedures (including critical care time)  Medications Ordered in UC Medications - No data to display  Initial Impression / Assessment and Plan / UC Course  I have reviewed the triage vital signs and the nursing notes.  Pertinent labs & imaging results that were available during my care of the patient were reviewed by me and considered in my medical decision making (see chart for details).     MDM: 1.  Cough-Rx'd cefdinir and Tessalon Perles; 2.  Allergic rhinitis -Rx'd Allegra.  Advised patient to take medication as directed with food to completion.  Advised patient to take Allegra with first dose of Cefdinir for the next 5 of 10 days.  Advised may use as needed afterwards for concurrent postnasal drainage/drip.  Advised may use Tessalon Perles daily or as needed for cough.  Encouraged patient to increase daily water intake while taking these medications.  Patient discharged home, hemodynamically stable. Final Clinical Impressions(s) / UC Diagnoses   Final diagnoses:  Cough, unspecified type  Allergic rhinitis, unspecified seasonality, unspecified trigger     Discharge Instructions      Advised patient to take medication as directed with food to completion.  Advised patient to take Allegra with first dose of Cefdinir for the next 5 of 10 days.  Advised may use as needed afterwards for concurrent postnasal drainage/drip.  Advised may use Tessalon Perles daily or as needed for cough.  Encouraged patient to increase daily water intake while taking these medications.     ED Prescriptions     Medication Sig Dispense Auth. Provider   cefdinir (OMNICEF) 300 MG capsule Take 1 capsule (300 mg total) by mouth 2 (two) times daily for  10 days. 20 capsule Eliezer Lofts, FNP   fexofenadine [facility] ALLERGY) 180 MG tablet Take 1 tablet (180 mg total) by mouth daily for 15 days. 15 tablet Eliezer Lofts, FNP   benzonatate (TESSALON) 200 MG capsule Take 1 capsule (200 mg total) by mouth 3 (three) times daily as needed for up to 7 days for cough. 50 capsule Eliezer Lofts, FNP      PDMP not reviewed this encounter.   Eliezer Lofts, [place] 01/04/21 1324

## 2021-01-04 NOTE — Discharge Instructions (Addendum)
Advised patient to take medication as directed with food to completion.  Advised patient to take Allegra with first dose of Cefdinir for the next 5 of 10 days.  Advised may use as needed afterwards for concurrent postnasal drainage/drip.  Advised may use Tessalon Perles daily or as needed for cough.  Encouraged patient to increase daily water intake while taking these medications.

## 2021-01-04 NOTE — ED Triage Notes (Signed)
Pt presents with cough x 2 weeks. Pt states she just completed chemo on thursday

## 2021-01-30 ENCOUNTER — Other Ambulatory Visit: Payer: Self-pay | Admitting: Family Medicine

## 2021-02-15 ENCOUNTER — Other Ambulatory Visit: Payer: Self-pay

## 2021-02-15 ENCOUNTER — Ambulatory Visit (INDEPENDENT_AMBULATORY_CARE_PROVIDER_SITE_OTHER): Payer: Medicare Other | Admitting: Family Medicine

## 2021-02-15 ENCOUNTER — Ambulatory Visit: Payer: Medicare Other | Admitting: Family Medicine

## 2021-02-15 ENCOUNTER — Encounter: Payer: Self-pay | Admitting: Family Medicine

## 2021-02-15 VITALS — BP 118/72 | HR 82 | Temp 98.2°F | Ht 65.5 in | Wt 202.0 lb

## 2021-02-15 DIAGNOSIS — Z20822 Contact with and (suspected) exposure to covid-19: Secondary | ICD-10-CM | POA: Diagnosis not present

## 2021-02-15 DIAGNOSIS — R0989 Other specified symptoms and signs involving the circulatory and respiratory systems: Secondary | ICD-10-CM

## 2021-02-15 DIAGNOSIS — J209 Acute bronchitis, unspecified: Secondary | ICD-10-CM | POA: Diagnosis not present

## 2021-02-15 LAB — POCT INFLUENZA A/B
Influenza A, POC: NEGATIVE
Influenza B, POC: NEGATIVE

## 2021-02-15 MED ORDER — HYDROCODONE BIT-HOMATROP MBR 5-1.5 MG/5ML PO SOLN: 5.0000 mL | Freq: Three times a day (TID) | ORAL | 0 refills | Status: DC | PRN

## 2021-02-15 MED ORDER — AZITHROMYCIN 250 MG PO TABS: ORAL_TABLET | ORAL | 0 refills | Status: AC

## 2021-02-15 MED ORDER — PREDNISONE 50 MG PO TABS: ORAL_TABLET | ORAL | 0 refills | Status: DC

## 2021-02-15 NOTE — Patient Instructions (Signed)
Start azithromycin and prednisone.  If not improving I would like to get a chest xray.  Continue supportive care.  You may use cough syrup as needed.

## 2021-02-15 NOTE — Assessment & Plan Note (Signed)
Reports azithromycin as well as 5-day burst of prednisone.  Recommend continued supportive care.  Instructed to contact clinic if symptoms continue to worsen or fail to improve with prescribed treatment.

## 2021-02-15 NOTE — Progress Notes (Signed)
Jasmine Montgomery - 71 y.o. female MRN [id number]  Date of birth: 17-Jan-1950  Subjective Chief Complaint  Patient presents with   URI    HPI Jasmine Montgomery is a 71 year old female here today with complaint of cough.  She has had cough for several weeks with recent worsening over the past week.  She has had associated wheezing and mild dyspnea at times.  She does get response to albuterol.  She has not had fever, chills, sinus pain, nausea, vomiting or diarrhea.  ROS:  A comprehensive ROS was completed and negative except as noted per HPI  No Known Allergies  Past Medical History:  Diagnosis Date   A-fib ([place])    Diabetes mellitus without complication ([place])    History of breast cancer    Hyperlipidemia    Hypertension    Stroke [facility])     History reviewed. No pertinent surgical history.  Social History   Socioeconomic History   Marital status: Widowed    Spouse name: Not on file   Number of children: Not on file   Years of education: Not on file   Highest education level: Not on file  Occupational History   Occupation: Retired  Tobacco Use   Smoking status: Every Day    Packs/day: 0.50    Types: Cigarettes   Smokeless tobacco: Never  Vaping Use   Vaping Use: Never used  Substance and Sexual Activity   Alcohol use: Never   Drug use: Never   Sexual activity: Never    Partners: Female, Female  Other Topics Concern   Not on file  Social History Narrative   Not on file   Social Determinants of Health   Financial Resource Strain: Not on file  Food Insecurity: Not on file  Transportation Needs: Not on file  Physical Activity: Not on file  Stress: Not on file  Social Connections: Not on file    Family History  Problem Relation Age of Onset   Breast cancer Sister    Diabetes Brother     Health Maintenance  Topic Date Due   FOOT EXAM  Never done   URINE MICROALBUMIN  Never done   Hepatitis C Screening  Never done   HEMOGLOBIN A1C  01/04/2021   OPHTHALMOLOGY EXAM   02/15/2021 (Originally 04/06/1962)   COVID-19 Vaccine (5 - Booster) 03/03/2021 (Originally 12/24/2019)   COLONOSCOPY (Pts 45-52yrs Insurance coverage will need to be confirmed)  04/02/2021 (Originally 04/05/1997)   Pneumonia Vaccine 65+ Years old (1 - PCV) 02/15/2022 (Originally 04/06/1958)   Zoster Vaccines- Shingrix (1 of 2) 05/16/2022 (Originally 04/06/1971)   MAMMOGRAM  09/18/2021   TETANUS/TDAP  09/30/2025   DEXA SCAN  Completed   HPV VACCINES  Aged Out   INFLUENZA VACCINE  Discontinued     ----------------------------------------------------------------------------------------------------------------------------------------------------------------------------------------------------------------- Physical Exam BP 118/72 (BP Location: Left Arm, Patient Position: Sitting, Cuff Size: Large)    Pulse 82    Temp 98.2 F (36.8 C) (Oral)    Ht 5' 5.5" (1.664 m)    Wt 202 lb (91.6 kg)    SpO2 97%    BMI 33.10 kg/m   Physical Exam Constitutional:      Appearance: Normal appearance.  Eyes:     General: No scleral icterus. Cardiovascular:     Rate and Rhythm: Normal rate and regular rhythm.  Pulmonary:     Effort: Pulmonary effort is normal.     Breath sounds: Wheezing present.  Musculoskeletal:     Cervical back: Neck supple.  Neurological:  General: No focal deficit present.     Mental Status: She is alert.  Psychiatric:        Mood and Affect: Mood normal.        Behavior: Behavior normal.    ------------------------------------------------------------------------------------------------------------------------------------------------------------------------------------------------------------------- Assessment and Plan  Acute bronchitis Reports azithromycin as well as 5-day burst of prednisone.  Recommend continued supportive care.  Instructed to contact clinic if symptoms continue to worsen or fail to improve with prescribed treatment.   Meds ordered this encounter   Medications   predniSONE (DELTASONE) 50 MG tablet    Sig: Take 1 tab po daily x5 days    Dispense:  5 tablet    Refill:  0   azithromycin (ZITHROMAX) 250 MG tablet    Sig: Take 2 tablets on day 1, then 1 tablet daily on days 2 through 5    Dispense:  6 tablet    Refill:  0   HYDROcodone bit-homatropine (HYCODAN) 5-1.5 MG/5ML syrup    Sig: Take 5 mLs by mouth every 8 (eight) hours as needed for cough.    Dispense:  120 mL    Refill:  0    No follow-ups on file.    This visit occurred during the SARS-CoV-2 public health emergency.  Safety protocols were in place, including screening questions prior to the visit, additional usage of staff PPE, and extensive cleaning of exam room while observing appropriate contact time as indicated for disinfecting solutions.

## 2021-02-16 LAB — NOVEL CORONAVIRUS, NAA: SARS-CoV-2, NAA: NOT DETECTED

## 2021-02-16 LAB — SARS-COV-2, NAA 2 DAY TAT

## 2021-03-27 ENCOUNTER — Other Ambulatory Visit: Payer: Self-pay | Admitting: Family Medicine

## 2021-04-06 ENCOUNTER — Other Ambulatory Visit: Payer: Self-pay | Admitting: Family Medicine

## 2021-05-10 ENCOUNTER — Emergency Department (INDEPENDENT_AMBULATORY_CARE_PROVIDER_SITE_OTHER)
Admission: EM | Admit: 2021-05-10 | Discharge: 2021-05-10 | Disposition: A | Discharge status: Home / Self Care | Payer: Medicare Other | Source: Home / Self Care

## 2021-05-10 DIAGNOSIS — J309 Allergic rhinitis, unspecified: Secondary | ICD-10-CM | POA: Diagnosis not present

## 2021-05-10 DIAGNOSIS — R059 Cough, unspecified: Secondary | ICD-10-CM

## 2021-05-10 MED ORDER — FEXOFENADINE HCL 180 MG PO TABS: 180.0000 mg | ORAL_TABLET | Freq: Every day | ORAL | 0 refills | Status: DC

## 2021-05-10 MED ORDER — HYDROCODONE BIT-HOMATROP MBR 5-1.5 MG/5ML PO SOLN
5.0000 mL | Freq: Four times a day (QID) | ORAL | 0 refills | Status: DC | PRN
Start: 2021-05-10 — End: 2021-08-03

## 2021-05-10 MED ORDER — AZITHROMYCIN 250 MG PO TABS: 250.0000 mg | ORAL_TABLET | Freq: Every day | ORAL | 0 refills | Status: DC

## 2021-05-10 NOTE — ED Triage Notes (Signed)
Pt c/o cough and congestion since last Thursday. Says no smell or taste. Low grade fever last night. No known covid exposure. Taking mucinex and OTC cold med prn.  ?

## 2021-05-10 NOTE — Discharge Instructions (Addendum)
Instructed patient to take medication as directed with food to completion.  Advised patient to take Allegra with Zithromax for the next 5 days.  Advised may use Allegra as needed afterwards for concurrent postnasal drainage/drip.  Advised may use Hycodan cough syrup daily or as needed for cough.  Advised/cautioned patient of sedative effects of this cough medication. ?

## 2021-05-10 NOTE — ED Provider Notes (Signed)
?[place] ? ? ? ?CSN: [id number] ?Arrival date & time: 05/10/21  3474 ? ? ?  ? ?History   ?Chief Complaint ?Chief Complaint  ?Patient presents with  ? Cough  ? Nasal Congestion  ? ? ?HPI ?Jasmine Montgomery is a 71 y.o. female.  ? ?HPI 80-year-old female presents with cough and nasal congestion for 1 week.  Reports lost her sense of smell and taste.  Patient reports no known COVID-19 exposure.  Reports taking Mucinex and OTC cold medicine as needed.  PMH significant for current everyday cigarette smoker, A-fib, stroke, and HTN ? ?Past Medical History:  ?Diagnosis Date  ? A-fib ([place])   ? Diabetes mellitus without complication ([place])   ? History of breast cancer   ? Hyperlipidemia   ? Hypertension   ? Stroke [facility])   ? ? ?Patient Active Problem List  ? Diagnosis Date Noted  ? Constipation 07/05/2020  ? Breast cancer ([place]) 04/04/2020  ? Acute bronchitis 04/04/2020  ? Hyperlipidemia associated with type 2 diabetes mellitus ([place]) 04/04/2020  ? S/P bilateral mastectomy 03/12/2020  ? History of COVID-19 03/04/2020  ? Paroxysmal atrial fibrillation ([place]) 12/12/2019  ? 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 Essential hypertension 03/09/2017  ? Intracranial hemorrhage ([place]) 03/09/2017  ? Type 2 diabetes mellitus without complication, without long-term current use of insulin ([place]) 03/09/2017  ? ? ?History reviewed. No pertinent surgical history. ? ?OB History   ?No obstetric history on file. ?  ? ? ? ?Home Medications   ? ?Prior to Admission medications   ?Medication Sig Start Date End Date Taking? Authorizing Provider  ?azithromycin (ZITHROMAX) 250 MG tablet Take 1 tablet (250 mg total) by mouth daily. Take first 2 tablets together, then 1 every day until finished. 05/10/21  Yes Eliezer Lofts, FNP  ?fexofenadine (ALLEGRA ALLERGY) 180 MG tablet Take 1 tablet (180 mg total) by mouth daily for 15 days. 05/10/21 05/25/21 Yes Eliezer Lofts, FNP  ?HYDROcodone bit-homatropine (HYCODAN) 5-1.5 MG/5ML syrup Take 5 mLs by mouth every 6  (six) hours as needed for cough. 05/10/21  Yes Eliezer Lofts, FNP  ?Ascorbic Acid (VITAMIN C PO) Take by mouth.    [provider]  ?Cholecalciferol 25 MCG (1000 UT) tablet Take by mouth.    [provider]  ?clotrimazole-betamethasone (LOTRISONE) cream Apply 1 application topically 2 (two) times daily. 09/23/20   Luetta Nutting, DO  ?letrozole [facility]) 2.5 MG tablet Take 2.5 mg by mouth daily. 06/02/20   [provider]  ?lisinopril (ZESTRIL) 10 MG tablet Take 1 tablet by mouth once daily 04/06/21   Luetta Nutting, DO  ?metFORMIN (GLUCOPHAGE) 1000 MG tablet Take 1 tablet (1,000 mg total) by mouth daily with breakfast. 11/04/20 02/02/21  Luetta Nutting, DO  ?ondansetron (ZOFRAN) 4 MG tablet Take 4 mg by mouth every 8 (eight) hours as needed. 08/23/20   [provider]  ?predniSONE (DELTASONE) 50 MG tablet Take 1 tab po daily x5 days 02/15/21   Luetta Nutting, DO  ?simvastatin (ZOCOR) 20 MG tablet Take 1 tablet by mouth once daily 10/14/20   Luetta Nutting, DO  ?valACYclovir (VALTREX) 1000 MG tablet Take 1,000 mg by mouth 3 (three) times daily. 02/05/20   [provider]  ?VENTOLIN HFA 108 (90 Base) MCG/ACT inhaler INHALE 1 TO 2 PUFFS INTO LUNGS EVERY 4 HOURS AS NEEDED FOR WHEEZING OR SHORTNESS OF BREATH 03/28/21   Luetta Nutting, DO  ?VITAMIN A PO Take by mouth.    [provider]  ?vitamin B-12 (CYANOCOBALAMIN) 500 MCG  tablet Take by mouth.    [provider]  ?VITAMIN E PO Take by mouth.    [provider]  ? ? ?Family History ?Family History  ?Problem Relation Age of Onset  ? Breast cancer Sister   ? Diabetes Brother   ? ? ?Social History ?Social History  ? ?Tobacco Use  ? Smoking status: Every Day  ?  Packs/day: 0.50  ?  Types: Cigarettes  ? Smokeless tobacco: Never  ?Vaping Use  ? Vaping Use: Never used  ?Substance Use Topics  ? Alcohol use: Never  ? Drug use: Never  ? ? ? ?Allergies   ?Patient has no known allergies. ? ? ?Review of Systems ?Review of  Systems  ?HENT:  Positive for congestion.   ?Respiratory:  Positive for cough.   ?All other systems reviewed and are negative. ? ? ?Physical Exam ?Triage Vital Signs ?ED Triage Vitals  ?Enc Vitals Group  ?   BP 05/10/21 0838 (!) 158/82  ?   Pulse Rate 05/10/21 0838 86  ?   Resp 05/10/21 0838 18  ?   Temp 05/10/21 0838 97.9 ?F (36.6 ?C)  ?   Temp Source 05/10/21 0838 Oral  ?   SpO2 05/10/21 0838 96 %  ?   Weight --   ?   Height --   ?   Head Circumference --   ?   Peak Flow --   ?   Pain Score 05/10/21 0841 0  ?   Pain Loc --   ?   Pain Edu? --   ?   Excl. in [place]? --   ? ?No data found. ? ?Updated Vital Signs ?BP (!) 158/82 (BP Location: Left Arm)   Pulse 86   Temp 97.9 ?F (36.6 ?C) (Oral)   Resp 18   SpO2 96%  ? ?   ? ?Physical Exam ?Vitals and nursing note reviewed.  ?Constitutional:   ?   Appearance: Normal appearance. She is normal weight. She is ill-appearing.  ?HENT:  ?   Head: Normocephalic and atraumatic.  ?   Right Ear: Tympanic membrane, ear canal and external ear normal.  ?   Left Ear: Tympanic membrane, ear canal and external ear normal.  ?   Mouth/Throat:  ?   Mouth: Mucous membranes are moist.  ?   Pharynx: Oropharynx is clear.  ?Eyes:  ?   Extraocular Movements: Extraocular movements intact.  ?   Conjunctiva/sclera: Conjunctivae normal.  ?   Pupils: Pupils are equal, round, and reactive to light.  ?Cardiovascular:  ?   Rate and Rhythm: Normal rate and regular rhythm.  ?   Pulses: Normal pulses.  ?   Heart sounds: Normal heart sounds.  ?Pulmonary:  ?   Effort: Pulmonary effort is normal.  ?   Breath sounds: Normal breath sounds. No wheezing, rhonchi or rales.  ?   Comments: Infrequent nonproductive cough noted on exam ?Musculoskeletal:  ?   Cervical back: Normal range of motion and neck supple.  ?Skin: ?   General: Skin is warm and dry.  ?Neurological:  ?   General: No focal deficit present.  ?   Mental Status: She is alert and oriented to person, place, and time.  ? ? ? ?UC Treatments / Results   ?Labs ?(all labs ordered are listed, but only abnormal results are displayed) ?Labs Reviewed - No data to display ? ?EKG ? ? ?Radiology ?No results found. ? ?Procedures ?Procedures (including critical care time) ? ?Medications Ordered  in UC ?Medications - No data to display ? ?Initial Impression / Assessment and Plan / UC Course  ?I have reviewed the triage vital signs and the nursing notes. ? ?Pertinent labs & imaging results that were available during my care of the patient were reviewed by me and considered in my medical decision making (see chart for details). ? ?  ? ?MDM: 1.  Cough-Rx'd Zithromax, Hycodan. Instructed patient to take medication as directed with food to completion.  Advised patient to take Allegra with Zithromax for the next 5 days.  Advised may use Allegra as needed afterwards for concurrent postnasal drainage/drip.  Advised may use Hycodan cough syrup daily or as needed for cough.  Advised/cautioned patient of sedative effects of this cough medication.  Patient discharged home, hemodynamically stable. ?Final Clinical Impressions(s) / UC Diagnoses  ? ?Final diagnoses:  ?Cough, unspecified type  ?Allergic rhinitis, unspecified seasonality, unspecified trigger  ? ? ? ?Discharge Instructions   ? ?  ?Instructed patient to take medication as directed with food to completion.  Advised patient to take Allegra with Zithromax for the next 5 days.  Advised may use Allegra as needed afterwards for concurrent postnasal drainage/drip.  Advised may use Hycodan cough syrup daily or as needed for cough.  Advised/cautioned patient of sedative effects of this cough medication. ? ? ? ? ?ED Prescriptions   ? ? Medication Sig Dispense Auth. Provider  ? azithromycin (ZITHROMAX) 250 MG tablet Take 1 tablet (250 mg total) by mouth daily. Take first 2 tablets together, then 1 every day until finished. 6 tablet Eliezer Lofts, FNP  ? fexofenadine (ALLEGRA ALLERGY) 180 MG tablet Take 1 tablet (180 mg total) by mouth  daily for 15 days. 15 tablet Eliezer Lofts, FNP  ? HYDROcodone bit-homatropine (HYCODAN) 5-1.5 MG/5ML syrup Take 5 mLs by mouth every 6 (six) hours as needed for cough. 120 mL Eliezer Lofts, FNP  ? ?  ? ?I hav

## 2021-05-26 ENCOUNTER — Other Ambulatory Visit: Payer: Self-pay | Admitting: Family Medicine

## 2021-06-02 LAB — HEMOGLOBIN A1C: Hemoglobin A1C: 6.8

## 2021-07-15 ENCOUNTER — Other Ambulatory Visit: Payer: Self-pay | Admitting: Family Medicine

## 2021-08-02 ENCOUNTER — Ambulatory Visit (INDEPENDENT_AMBULATORY_CARE_PROVIDER_SITE_OTHER): Payer: Medicare Other | Admitting: Family Medicine

## 2021-08-02 VITALS — BP 125/47 | HR 80 | Ht 65.5 in | Wt 216.0 lb

## 2021-08-02 DIAGNOSIS — Z0000 Encounter for general adult medical examination without abnormal findings: Secondary | ICD-10-CM

## 2021-08-02 NOTE — Patient Instructions (Addendum)
[place] Maintenance Summary and Written Plan of Care  [name] ,  Thank you for allowing me to perform your Medicare Annual Wellness Visit and for your ongoing commitment to your health.   Health Maintenance & Immunization History Health Maintenance  Topic Date Due   OPHTHALMOLOGY EXAM  08/02/2021 (Originally 04/06/1960)   Diabetic kidney evaluation - GFR measurement  08/03/2021 (Originally 03/30/2021)   Diabetic kidney evaluation - Urine ACR  08/03/2021 (Originally [birth year]/06/1970)   FOOT EXAM  08/03/2021 (Originally 04/06/1960)   COVID-19 Vaccine (5 - Booster for Pfizer series) 08/18/2021 (Originally 12/24/2019)   Pneumonia Vaccine 20+ Years old (1 - PCV) 02/15/2022 (Originally 04/06/1956)   Zoster Vaccines- Shingrix (1 of 2) 05/16/2022 (Originally 04/06/1971)   COLONOSCOPY (Pts 45-87yr Insurance coverage will need to be confirmed)  08/03/2022 (Originally 04/05/1997)   Hepatitis C Screening  08/03/2022 (Originally 04/06/1968)   INFLUENZA VACCINE  08/09/2021   MAMMOGRAM  09/18/2021   HEMOGLOBIN A1C  12/03/2021   TETANUS/TDAP  09/30/2025   DEXA SCAN  01/05/2030   HPV VACCINES  Aged Out   Immunization History  Administered Date(s) Administered   PFIZER Comirnaty(Gray Top)Covid-19 Tri-Sucrose Vaccine 10/08/2019, 10/29/2019   PFIZER(Purple Top)SARS-COV-2 Vaccination 09/18/2019, 10/29/2019   Td 10/01/2015   Tdap 10/01/2015    These are the patient goals that we discussed:  Goals Addressed               This Visit's Progress     Patient Stated (pt-stated)        Would like to get her strength back.          This is a list of Health Maintenance Items that are overdue or due now: Foot exam  Urine ACR Eye exam [facility]. Colorectal cancer screening- Patient declined at this time.  Orders/Referrals Placed Today: No orders  of the defined types were placed in this encounter.  (Contact our referral department at [phone] you have not spoken with someone about your referral appointment within the next 5 days)    Follow-up Plan Follow-up with MLuetta Nutting DO as planned Schedule your exam.  Foot exam and Urine ACR can be done at your next visit. Bring your vaccine dates to your next appointment.  Medicare wellness visit in one year.  AVS printed and given to the patient.      Health Maintenance, Female Adopting a healthy lifestyle and getting preventive care are important in promoting health and wellness. Ask your health care provider about: The right schedule for you to have regular tests and exams. Things you can do on your own to prevent diseases and keep yourself healthy. What should I know about diet, weight, and exercise? Eat a healthy diet  Eat a diet that includes plenty of vegetables, fruits, low-fat dairy products, and lean protein. Do not eat a lot of foods that are high in solid fats, added sugars, or sodium. Maintain a healthy weight Body mass index (BMI) is used to identify weight problems. It estimates body fat based on height and weight. Your health care provider can help determine your BMI and help you achieve or maintain a healthy weight. Get regular exercise Get regular exercise. This is one of the most important things you can do for your health. Most adults should: Exercise for at least 150 minutes each week. The exercise should increase your  heart rate and make you sweat (moderate-intensity exercise). Do strengthening exercises at least twice a week. This is in addition to the moderate-intensity exercise. Spend less time sitting. Even light physical activity can be beneficial. Watch cholesterol and blood lipids Have your blood tested for lipids and cholesterol at 69 years of age, then have this test every 5 years. Have your cholesterol levels checked more often if: Your  lipid or cholesterol levels are high. You are older than 69 years of age. You are at high risk for heart disease. What should I know about cancer screening? Depending on your health history and family history, you may need to have cancer screening at various ages. This may include screening for: Breast cancer. Cervical cancer. Colorectal cancer. Skin cancer. Lung cancer. What should I know about heart disease, diabetes, and high blood pressure? Blood pressure and heart disease High blood pressure causes heart disease and increases the risk of stroke. This is more likely to develop in people who have high blood pressure readings or are overweight. Have your blood pressure checked: Every 3-5 years if you are 7-48 years of age. Every year if you are 31 years old or older. Diabetes Have regular diabetes screenings. This checks your fasting blood sugar level. Have the screening done: Once every three years after age 3 if you are at a normal weight and have a low risk for diabetes. More often and at a younger age if you are overweight or have a high risk for diabetes. What should I know about preventing infection? Hepatitis B If you have a higher risk for hepatitis B, you should be screened for this virus. Talk with your health care provider to find out if you are at risk for hepatitis B infection. Hepatitis C Testing is recommended for: Everyone born from 1 through 1963. Anyone with known risk factors for hepatitis C. Sexually transmitted infections (STIs) Get screened for STIs, including gonorrhea and chlamydia, if: You are sexually active and are younger than 69 years of age. You are older than 69 years of age and your health care provider tells you that you are at risk for this type of infection. Your sexual activity has changed since you were last screened, and you are at increased risk for chlamydia or gonorrhea. Ask your health care provider if you are at risk. Ask your health  care provider about whether you are at high risk for HIV. Your health care provider may recommend a prescription medicine to help prevent HIV infection. If you choose to take medicine to prevent HIV, you should first get tested for HIV. You should then be tested every 3 months for as long as you are taking the medicine. Pregnancy If you are about to stop having your period (premenopausal) and you may become pregnant, seek counseling before you get pregnant. Take 400 to 800 micrograms (mcg) of folic acid every day if you become pregnant. Ask for birth control (contraception) if you want to prevent pregnancy. Osteoporosis and menopause Osteoporosis is a disease in which the bones lose minerals and strength with aging. This can result in bone fractures. If you are 23 years old or older, or if you are at risk for osteoporosis and fractures, ask your health care provider if you should: Be screened for bone loss. Take a calcium or vitamin D supplement to lower your risk of fractures. Be given hormone replacement therapy (HRT) to treat symptoms of menopause. Follow these instructions at home: Alcohol use Do not drink alcohol  if: Your health care provider tells you not to drink. You are pregnant, may be pregnant, or are planning to become pregnant. If you drink alcohol: Limit how much you have to: 0-1 drink a day. Know how much alcohol is in your drink. In the [place] one drink equals one 12 oz bottle of beer (355 mL), one 5 oz glass of wine (148 mL), or one 1 oz glass of hard liquor (44 mL). Lifestyle Do not use any products that contain nicotine or tobacco. These products include cigarettes, chewing tobacco, and vaping devices, such as e-cigarettes. If you need help quitting, ask your health care provider. Do not use street drugs. Do not share needles. Ask your health care provider for help if you need support or information about quitting drugs. General instructions Schedule regular health,  dental, and eye exams. Stay current with your vaccines. Tell your health care provider if: You often feel depressed. You have ever been abused or do not feel safe at home. Summary Adopting a healthy lifestyle and getting preventive care are important in promoting health and wellness. Follow your health care provider's instructions about healthy diet, exercising, and getting tested or screened for diseases. Follow your health care provider's instructions on monitoring your cholesterol and blood pressure. This information is not intended to replace advice given to you by your health care provider. Make sure you discuss any questions you have with your health care provider. Document Revised: 05/17/2020 Document Reviewed: 05/17/2020 Elsevier Patient Education  [place].

## 2021-08-02 NOTE — Progress Notes (Signed)
MEDICARE ANNUAL WELLNESS VISIT  08/02/2021  Subjective:  Jasmine Montgomery is a 71 y.o. female patient of Luetta Nutting, DO who had a Medicare Annual Wellness Visit today. Keshona is Retired and lives with their daughter & family. she has 3 children. she reports that she is socially active and does interact with friends/family regularly. she is minimally physically active and enjoys quilting and painting pillowcase.  Patient Care Team: Luetta Nutting, DO as PCP - General (Family Medicine)     08/02/2021    9:11 AM 04/02/2020    2:09 PM  Advanced Directives  Does Patient Have a Medical Advance Directive? No No  Would patient like information on creating a medical advance directive? Yes (ED - Information included in AVS) No - Patient declined    Hospital Utilization Over the Past 12 Months: # of hospitalizations or ER visits: 0 # of surgeries: 0  Review of Systems    Patient reports that her overall health is better when compared to last year.  Review of Systems: History obtained from chart review and the patient  All other systems negative.  Pain Assessment Pain : No/denies pain     Current Medications & Allergies (verified) Allergies as of 08/02/2021   No Known Allergies      Medication List        Accurate as of August 02, 2021  9:47 AM. If you have any questions, ask your nurse or doctor.          anastrozole 1 MG tablet Commonly known as: ARIMIDEX Take 1 mg by mouth daily.   azithromycin 250 MG tablet Commonly known as: ZITHROMAX Take 1 tablet (250 mg total) by mouth daily. Take first 2 tablets together, then 1 every day until finished.   Cholecalciferol 25 MCG (1000 UT) tablet Take by mouth.   clotrimazole-betamethasone cream Commonly known as: LOTRISONE Apply 1 application topically 2 (two) times daily.   cyanocobalamin 500 MCG tablet Commonly known as: CYANOCOBALAMIN Take by mouth.   fexofenadine 180 MG tablet Commonly known as: Allegra  Allergy Take 1 tablet (180 mg total) by mouth daily for 15 days.   HYDROcodone bit-homatropine 5-1.5 MG/5ML syrup Commonly known as: HYCODAN Take 5 mLs by mouth every 6 (six) hours as needed for cough.   letrozole 2.5 MG tablet Commonly known as: FEMARA Take 2.5 mg by mouth daily.   lisinopril 10 MG tablet Commonly known as: ZESTRIL Take 1 tablet by mouth once daily   metFORMIN 1000 MG tablet Commonly known as: GLUCOPHAGE Take by mouth.   metFORMIN 1000 MG tablet Commonly known as: GLUCOPHAGE Take 1 tablet (1,000 mg total) by mouth daily with breakfast.   ondansetron 4 MG tablet Commonly known as: ZOFRAN Take 4 mg by mouth every 8 (eight) hours as needed.   predniSONE 50 MG tablet Commonly known as: DELTASONE Take 1 tab po daily x5 days   simvastatin 20 MG tablet Commonly known as: ZOCOR Take 1 tablet by mouth once daily   valACYclovir 1000 MG tablet Commonly known as: VALTREX Take 1,000 mg by mouth 3 (three) times daily.   Ventolin HFA 108 (90 Base) MCG/ACT inhaler Generic drug: albuterol INHALE 1 TO 2 PUFFS BY MOUTH EVERY 4 HOURS AS NEEDED FOR WHEEZING AND FOR SHORTNESS OF BREATH   VITAMIN A PO Take by mouth.   VITAMIN C PO Take by mouth.   VITAMIN E PO Take by mouth.        History (reviewed): Past Medical History:  Diagnosis Date  A-fib ([place])    Cancer ([place]) 08/2019   Diabetes mellitus without complication ([place])    History of breast cancer    Hyperlipidemia    Hypertension    Stroke [facility])    Past Surgical History:  Procedure Laterality Date   BREAST SURGERY  03/2020   HERNIA REPAIR  2010   SMALL INTESTINE SURGERY  03/2016   Family History  Problem Relation Age of Onset   Breast cancer Sister    Cancer Sister    Diabetes Brother    Hypertension Mother    Miscarriages / [place] Mother    Varicose Veins Mother    Cancer Paternal Grandmother    Obesity Paternal Grandmother    Social History   Socioeconomic History   Marital  status: Widowed    Spouse name: Not on file   Number of children: 3   Years of education: 14   Highest education level: Associate degree: academic program  Occupational History   Occupation: Retired    Comment: retired Marine scientist  Tobacco Use   Smoking status: Every Day    Packs/day: 0.50    Years: 15.00    Total pack years: 7.50    Types: Cigarettes   Smokeless tobacco: Never  Vaping Use   Vaping Use: Never used  Substance and Sexual Activity   Alcohol use: Not Currently   Drug use: Not Currently    Types: Marijuana   Sexual activity: Not Currently    Partners: Female, Female    Birth control/protection: Post-menopausal  Other Topics Concern   Not on file  Social History Narrative   Lives with her daughter and her family. She enjoys quilting and painting pillow cases.   Social Determinants of Health   Financial Resource Strain: Low Risk  (07/27/2021)   Overall Financial Resource Strain (CARDIA)    Difficulty of Paying Living Expenses: Not hard at all  Food Insecurity: No Food Insecurity (07/27/2021)   Hunger Vital Sign    Worried About Running Out of Food in the Last Year: Never true    Ran Out of Food in the Last Year: Never true  Transportation Needs: No Transportation Needs (07/27/2021)   PRAPARE - Hydrologist (Medical): No    Lack of Transportation (Non-Medical): No  Physical Activity: Insufficiently Active (07/27/2021)   Exercise Vital Sign    Days of Exercise per Week: 2 days    Minutes of Exercise per Session: 30 min  Stress: No Stress Concern Present (07/27/2021)   [place]    Feeling of Stress : Not at all  Social Connections: Socially Isolated (08/02/2021)   Social Connection and Isolation Panel [NHANES]    Frequency of Communication with Friends and Family: More than three times a week    Frequency of Social Gatherings with Friends and Family: Once a week    Attends  Religious Services: Never    Marine scientist or Organizations: No    Attends Archivist Meetings: Never    Marital Status: Widowed    Activities of Daily Living    08/02/2021    9:15 AM 07/27/2021    1:43 PM  In your present state of health, do you have any difficulty performing the following activities:  Hearing? 0 0  Vision? 0 0  Difficulty concentrating or making decisions? 0 0  Walking or climbing stairs? 1 1  Comment due to joint pain.   Dressing or bathing?  0 0  Doing errands, shopping? 0 0  Preparing Food and eating ? N N  Using the Toilet? N N  In the past six months, have you accidently leaked urine? Y Y  Comment stress incontinence.   Do you have problems with loss of bowel control? N N  Managing your Medications? N N  Managing your Finances? N N  Housekeeping or managing your Housekeeping? 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   Depression Screen    08/02/2021    9:21 AM 04/02/2020    2:09 PM  PHQ 2/9 Scores  PHQ - 2 Score 0 1  PHQ- 9 Score  1     Fall Risk    08/02/2021    9:20 AM 07/27/2021    1:43 PM 04/02/2020    2:09 PM  Fall Risk   Falls in the past year? 0 0 0  Number falls in past yr: 0 0 0  Injury with Fall? 0 0 0  Risk for fall due to : No Fall Risks  No Fall Risks  Follow up Falls evaluation completed  Falls evaluation completed     Objective:   BP (!) 125/47 (BP Location: Left  Arm, Patient Position: Sitting, Cuff Size: Large)   Pulse 80   Ht 5' 5.5" (1.664 m)   Wt 216 lb (98 kg)   SpO2 97%   BMI 35.40 kg/m   Last Weight  Most recent update: 08/02/2021  9:04 AM    Weight  98 kg (216 lb)             Body mass index is 35.4 kg/m.  Hearing/Vision  Shania did not have difficulty with hearing/understanding during the face-to-face interview Jazmon did not have difficulty with her vision during the face-to-face interview Reports that she has not had a formal eye exam by an eye care professional within the past year Reports that she has not had a formal hearing evaluation within the past year  Cognitive Function:    08/02/2021    9:28 AM  6CIT Screen  What Year? 0 points  What month? 0 points  What time? 0 points  Count back from 20 0 points  Months in reverse 0 points  Repeat phrase 2 points  Total Score 2 points    Normal Cognitive Function Screening: Yes (Normal:0-7, Significant for Dysfunction: >8)  Immunization & Health Maintenance Record Immunization History  Administered Date(s) Administered   PFIZER Comirnaty(Gray Top)Covid-19 Tri-Sucrose Vaccine 10/08/2019, 10/29/2019   PFIZER(Purple Top)SARS-COV-2 Vaccination 09/18/2019, 10/29/2019   Td 10/01/2015   Tdap 10/01/2015    Health Maintenance  Topic Date Due   OPHTHALMOLOGY EXAM  08/02/2021 (Originally 04/06/1960)   Diabetic kidney evaluation - GFR measurement  08/03/2021 (Originally 03/30/2021)   Diabetic kidney evaluation - Urine ACR  08/03/2021 (Originally 04/06/1970)   FOOT EXAM  08/03/2021 (Originally 04/06/1960)   COVID-19 Vaccine (5 - Booster for [place] series) 08/18/2021 (Originally 12/24/2019)   Pneumonia Vaccine 65+  Years old (1 - PCV) 02/15/2022 (Originally 04/06/1958)   Zoster Vaccines- Shingrix (1 of 2) 05/16/2022 (Originally 04/06/1971)   COLONOSCOPY (Pts 45-84yr Insurance coverage will need to be confirmed)  08/03/2022 (Originally 04/05/1997)   Hepatitis C Screening   08/03/2022 (Originally 04/06/1970)   INFLUENZA VACCINE  08/09/2021   MAMMOGRAM  09/18/2021   HEMOGLOBIN A1C  12/03/2021   TETANUS/TDAP  09/30/2025   DEXA SCAN  01/05/2030   HPV VACCINES  Aged Out       Assessment  This is a routine wellness examination for PR[place]  Health Maintenance: Due or Overdue There are no preventive care reminders to display for this patient.   PMikey Bussingdoes not need a referral for CCommercial Metals CompanyAssistance: Care Management:   no Social Work:    no Prescription Assistance:  no Nutrition/Diabetes Education:  no   Plan:  Personalized Goals  Goals Addressed               This Visit's Progress     Patient Stated (pt-stated)        Would like to get her strength back.        Personalized Health Maintenance & Screening Recommendations  Foot exam  Urine ACR Eye exam [facility]. Colorectal cancer screening- Patient declined at this time.  Lung Cancer Screening Recommended: Patient has CT scans done with her oncologist. (Low Dose CT Chest recommended if Age 69-80years, 30 pack-year currently smoking OR have quit w/in past 15 years) Hepatitis C Screening recommended: no HIV Screening recommended: no  Advanced Directives: Written information was not given per the patient's request.  Referrals & Orders No orders of the defined types were placed in this encounter.   Follow-up Plan Follow-up with MLuetta Nutting DO as planned Schedule your exam. Foot exam and Urine ACR can be done at your next visit. Bring your vaccine dates to your next appointment.  Medicare wellness visit in one year.  AVS printed and given to the patient.   I have personally reviewed and noted the following in the patient's chart:   Medical and social history Use of alcohol, tobacco or illicit drugs  Current medications and  supplements Functional ability and status Nutritional status Physical activity Advanced directives List of other physicians Hospitalizations, surgeries, and ER visits in previous 12 months Vitals Screenings to include cognitive, depression, and falls Referrals and appointments  In addition, I have reviewed and discussed with patient certain preventive protocols, quality metrics, and best practice recommendations. A written personalized care plan for preventive services as well as general preventive health recommendations were provided to patient.     BTinnie Gens RN BSN  08/02/2021

## 2021-08-03 ENCOUNTER — Telehealth: Payer: Self-pay

## 2021-08-03 ENCOUNTER — Encounter: Payer: Self-pay | Admitting: Family Medicine

## 2021-08-03 ENCOUNTER — Ambulatory Visit (INDEPENDENT_AMBULATORY_CARE_PROVIDER_SITE_OTHER): Payer: Medicare Other | Admitting: Family Medicine

## 2021-08-03 VITALS — BP 126/73 | HR 80 | Ht 65.5 in | Wt 216.0 lb

## 2021-08-03 DIAGNOSIS — C50919 Malignant neoplasm of unspecified site of unspecified female breast: Secondary | ICD-10-CM | POA: Diagnosis not present

## 2021-08-03 DIAGNOSIS — E119 Type 2 diabetes mellitus without complications: Secondary | ICD-10-CM

## 2021-08-03 DIAGNOSIS — E1169 Type 2 diabetes mellitus with other specified complication: Secondary | ICD-10-CM

## 2021-08-03 DIAGNOSIS — I10 Essential (primary) hypertension: Secondary | ICD-10-CM

## 2021-08-03 DIAGNOSIS — E785 Hyperlipidemia, unspecified: Secondary | ICD-10-CM

## 2021-08-03 MED ORDER — OZEMPIC (0.25 OR 0.5 MG/DOSE) 2 MG/3ML ~~LOC~~ SOPN: PEN_INJECTOR | SUBCUTANEOUS | 3 refills | Status: DC

## 2021-08-03 MED ORDER — METFORMIN HCL 1000 MG PO TABS
1000.0000 mg | ORAL_TABLET | Freq: Two times a day (BID) | ORAL | 1 refills | Status: DC
Start: 2021-08-03 — End: 2022-02-10

## 2021-08-03 NOTE — Assessment & Plan Note (Addendum)
She has gained some weight since last visit.  She is interested in trying GLP-1.  Adding Ozempic with titration over the next month. Continue metformin for now.  Follow up in 3 months.

## 2021-08-03 NOTE — Patient Instructions (Signed)
Let's start ozempic 0.'25mg'$  then increase to 0.'5mg'$  after 1 month Follow up with me in 3 months.

## 2021-08-03 NOTE — Assessment & Plan Note (Signed)
Update lipid panel today 

## 2021-08-03 NOTE — Progress Notes (Signed)
Jasmine Montgomery - 71 y.o. female MRN [id number]  Date of birth: 1950-12-16  Subjective Chief Complaint  Patient presents with   Diabetes    HPI Jasmine Montgomery is a 71 y.o. female here today for follow up  Current management with metformin.  Doing well with this.  Denies side effects at this time.  Had A1c completed through [facility] in 05/2021.  This was 6.6%.  Tolerating simvastatin well for associated HLD.    BP remains well controlled with lisinopril.  Tolerating well without side effects.  Denies chest pain, shortness of breath, palpitations, headache or vision changes.    Continues treatment for breast cancer.  [facility].    ROS:  A comprehensive ROS was completed and negative except as noted per HPI  No Known Allergies  Past Medical History:  Diagnosis Date   A-fib ([place])    Cancer ([place]) 08/2019   Diabetes mellitus without complication ([place])    History of breast cancer    Hyperlipidemia    Hypertension    Stroke [facility])     Past Surgical History:  Procedure Laterality Date   BREAST SURGERY  03/2020   HERNIA REPAIR  2010   SMALL INTESTINE SURGERY  03/2016    Social History   Socioeconomic History   Marital status: Widowed    Spouse name: Not on file   Number of children: 3   Years of education: 14   Highest education level: Associate degree: academic program  Occupational History   Occupation: Retired    Comment: retired Marine scientist  Tobacco Use   Smoking status: Every Day    Packs/day: 0.50    Years: 15.00    Total pack years: 7.50    Types: Cigarettes   Smokeless tobacco: Never  Vaping Use   Vaping Use: Never used  Substance and Sexual Activity   Alcohol use: Not Currently   Drug use: Not Currently    Types: Marijuana   Sexual activity: Not Currently    Partners: Female, Female    Birth control/protection: Post-menopausal  Other Topics Concern   Not on file  Social History Narrative   Lives with her daughter and her family. She  enjoys quilting and painting pillow cases.   Social Determinants of Health   Financial Resource Strain: Low Risk  (07/27/2021)   Overall Financial Resource Strain (CARDIA)    Difficulty of Paying Living Expenses: Not hard at all  Food Insecurity: No Food Insecurity (07/27/2021)   Hunger Vital Sign    Worried About Running Out of Food in the Last Year: Never true    Ran Out of Food in the Last Year: Never true  Transportation Needs: No Transportation Needs (07/27/2021)   PRAPARE - Hydrologist (Medical): No    Lack of Transportation (Non-Medical): No  Physical Activity: Insufficiently Active (07/27/2021)   Exercise Vital Sign    Days of Exercise per Week: 2 days    Minutes of Exercise per Session: 30 min  Stress: No Stress Concern Present (07/27/2021)   [place]    Feeling of Stress : Not at all  Social Connections: Socially Isolated (08/02/2021)   Social Connection and Isolation Panel [NHANES]    Frequency of Communication with Friends and Family: More than three times a week    Frequency of Social Gatherings with Friends and Family: Once a week    Attends Religious Services: Never    Active Member  of Clubs or Organizations: No    Attends Archivist Meetings: Never    Marital Status: Widowed    Family History  Problem Relation Age of Onset   Breast cancer Sister    Cancer Sister    Diabetes Brother    Hypertension Mother    Miscarriages / Stillbirths Mother    Varicose Veins Mother    Cancer Paternal Grandmother    Obesity Paternal Grandmother     Health Maintenance  Topic Date Due   Diabetic kidney evaluation - GFR measurement  08/03/2021 (Originally 03/30/2021)   Diabetic kidney evaluation - Urine ACR  08/03/2021 (Originally 04/06/1970)   FOOT EXAM  08/03/2021 (Originally 04/06/1960)   COVID-19 Vaccine (5 - Booster for Pfizer series) 08/18/2021 (Originally 12/24/2019)    OPHTHALMOLOGY EXAM  11/03/2021 (Originally [birth year]/06/1962)   Pneumonia Vaccine 74+ Years old (1 - PCV) 02/15/2022 (Originally 04/06/1958)   Zoster Vaccines- Shingrix (1 of 2) 05/16/2022 (Originally 04/06/1971)   COLONOSCOPY (Pts 45-85yr Insurance coverage will need to be confirmed)  08/03/2022 (Originally 04/05/1997)   Hepatitis C Screening  08/03/2022 (Originally 04/06/1970)   INFLUENZA VACCINE  08/09/2021   MAMMOGRAM  09/18/2021   HEMOGLOBIN A1C  12/03/2021   TETANUS/TDAP  09/30/2025   DEXA SCAN  01/05/2030   HPV VACCINES  Aged Out     ----------------------------------------------------------------------------------------------------------------------------------------------------------------------------------------------------------------- Physical Exam BP 126/73 (BP Location: Left Arm, Patient Position: Sitting, Cuff Size: Large)   Pulse 80   Ht 5' 5.5" (1.664 m)   Wt 216 lb (98 kg)   SpO2 97%   BMI 35.40 kg/m   Physical Exam Constitutional:      Appearance: Normal appearance.  Eyes:     General: No scleral icterus. Cardiovascular:     Rate and Rhythm: Normal rate and regular rhythm.  Pulmonary:     Effort: Pulmonary effort is normal.     Breath sounds: Normal breath sounds.  Musculoskeletal:     Cervical back: Neck supple.  Neurological:     Mental Status: She is alert.  Psychiatric:        Mood and Affect: Mood normal.        Behavior: Behavior normal.     ------------------------------------------------------------------------------------------------------------------------------------------------------------------------------------------------------------------- Assessment and Plan  Essential hypertension BP is well controlled, continue lisinopril.    Type 2 diabetes mellitus without complication, without long-term current use of insulin ([place] She has gained some weight since last visit.  She is interested in trying GLP-1.  Adding Ozempic with titration over the  next month. Continue metformin for now.  Follow up in 3 months.   Breast cancer ([facility] Management per oncology. Stable at this time.   Hyperlipidemia associated with type 2 diabetes mellitus ([place] Update lipid panel today.    Meds ordered this encounter  Medications   Semaglutide,0.25 or 0.'5MG'$ /DOS, (OZEMPIC, 0.25 OR 0.5 MG/DOSE,) 2 MG/3ML SOPN    Sig: Start 0.'25mg'$  x4 weeks then increase to 0.'5mg'$ .    Dispense:  3 mL    Refill:  3   metFORMIN (GLUCOPHAGE) 1000 MG tablet    Sig: Take 1 tablet (1,000 mg total) by mouth 2 (two) times daily with a meal.    Dispense:  180 tablet    Refill:  1    No follow-ups on file.    This visit occurred during the SARS-CoV-2 public health emergency.  Safety protocols were in place, including screening questions prior to the visit, additional usage of staff PPE, and extensive cleaning of exam room while observing appropriate contact time as  indicated for disinfecting solutions.

## 2021-08-03 NOTE — Assessment & Plan Note (Addendum)
BP is well controlled, continue lisinopril.

## 2021-08-03 NOTE — Telephone Encounter (Signed)
Initiated Prior authorization [facility]:[id number] (0.25 or 0.5 MG/DOSE) '2MG'$ /3ML pen-injectors Via: Covermymeds Case/Key:[id number] Status: approved  as of 08/03/21 Reason:Coverage Start Date:07/04/2021;Coverage End Date:08/03/2022; Notified Pt via: Mychart

## 2021-08-03 NOTE — Assessment & Plan Note (Signed)
Management per oncology. Stable at this time.

## 2021-08-04 LAB — MICROALBUMIN / CREATININE URINE RATIO
Creatinine, Urine: 90 mg/dL (ref 20–275)
Microalb Creat Ratio: 3 mcg/mg creat (ref <30)
Microalb, Ur: 0.3 mg/dL

## 2021-08-04 LAB — BASIC METABOLIC PANEL WITH GFR
BUN: 18 mg/dL (ref 7–25)
CO2: 25 mmol/L (ref 20–32)
Calcium: 9.4 mg/dL (ref 8.6–10.4)
Chloride: 103 mmol/L (ref 98–110)
Creat: 0.88 mg/dL (ref 0.50–1.05)
Glucose, Bld: 157 mg/dL — ABNORMAL HIGH (ref 65–99)
Potassium: 4.4 mmol/L (ref 3.5–5.3)
Sodium: 139 mmol/L (ref 135–146)
eGFR: 71 mL/min/{1.73_m2} (ref >=60)

## 2021-08-04 LAB — LIPID PANEL W/REFLEX DIRECT LDL
Cholesterol: 165 mg/dL (ref <200)
HDL: 57 mg/dL (ref >=50)
LDL Cholesterol (Calc): 87 mg/dL (calc)
Non-HDL Cholesterol (Calc): 108 mg/dL (calc) (ref <130)
Total CHOL/HDL Ratio: 2.9 (calc) (ref <5.0)
Triglycerides: 114 mg/dL (ref <150)

## 2021-08-29 ENCOUNTER — Telehealth: Payer: Self-pay | Admitting: Family Medicine

## 2021-08-29 NOTE — Telephone Encounter (Signed)
Patient came into office to drop off paperwork to acquire diabetic shoes. Patient has already been to the foot doctor and needs Dr. Zigmund Daniel to sign and fill out provided form. Patient was notified of potential fee and 3-5 day turn around. Paperwork is placed in provider's box. Jasmine Montgomery

## 2021-09-06 ENCOUNTER — Encounter: Payer: Self-pay | Admitting: Family Medicine

## 2021-09-06 ENCOUNTER — Ambulatory Visit (INDEPENDENT_AMBULATORY_CARE_PROVIDER_SITE_OTHER): Payer: Medicare Other | Admitting: Family Medicine

## 2021-09-06 VITALS — BP 151/81 | HR 80 | Ht 65.5 in | Wt 220.0 lb

## 2021-09-06 DIAGNOSIS — M654 Radial styloid tenosynovitis [de Quervain]: Secondary | ICD-10-CM

## 2021-09-06 DIAGNOSIS — M25532 Pain in left wrist: Secondary | ICD-10-CM | POA: Diagnosis not present

## 2021-09-06 MED ORDER — METHYLPREDNISOLONE 4 MG PO TBPK: ORAL_TABLET | ORAL | 0 refills | Status: DC

## 2021-09-06 NOTE — Patient Instructions (Signed)
Wear splint daily for the next 7-10 days.  If you have continued pain you can wear this longer.  Use voltaren gel 3-4 times per day.  Start medrol dosepak.  If not improving after 2-3 weeks let me know.

## 2021-09-06 NOTE — Assessment & Plan Note (Addendum)
Provided with thumb spica wrist splint.  Recommend applying voltaren gel qid.  Adding medrol dosepak as well.  Checking uric acid. Let me know if not improving after 2-3 weeks.

## 2021-09-06 NOTE — Progress Notes (Signed)
Jasmine Montgomery - 71 y.o. female MRN [id number]  Date of birth: 09-21-1950  Subjective Chief Complaint  Patient presents with   Wrist Pain    HPI Jasmine Montgomery is a 71 y.o. female here today for with complaint of L wrist pain.  Symptoms started about 2 weeks ago.  Does not recall any injury or overuse.  Has known OA of the thumbs but this is different.  Current pain located around the radial side of the wrist and radiates into the mid forearm.  Denies numbness/tingling.  Has tried ibuprofen with minimal relief.  ]  ROS:  A comprehensive ROS was completed and negative except as noted per HPI  No Known Allergies  Past Medical History:  Diagnosis Date   A-fib ([place])    Cancer ([place]) 08/2019   Diabetes mellitus without complication ([place])    History of breast cancer    Hyperlipidemia    Hypertension    Stroke [facility])     Past Surgical History:  Procedure Laterality Date   BREAST SURGERY  03/2020   HERNIA REPAIR  2010   SMALL INTESTINE SURGERY  03/2016    Social History   Socioeconomic History   Marital status: Widowed    Spouse name: Not on file   Number of children: 3   Years of education: 14   Highest education level: Associate degree: academic program  Occupational History   Occupation: Retired    Comment: retired Marine scientist  Tobacco Use   Smoking status: Every Day    Packs/day: 0.50    Years: 15.00    Total pack years: 7.50    Types: Cigarettes   Smokeless tobacco: Never  Vaping Use   Vaping Use: Never used  Substance and Sexual Activity   Alcohol use: Not Currently   Drug use: Not Currently    Types: Marijuana   Sexual activity: Not Currently    Partners: Female, Female    Birth control/protection: Post-menopausal  Other Topics Concern   Not on file  Social History Narrative   Lives with her daughter and her family. She enjoys quilting and painting pillow cases.   Social Determinants of Health   Financial Resource Strain: Low Risk  (07/27/2021)   Overall  Financial Resource Strain (CARDIA)    Difficulty of Paying Living Expenses: Not hard at all  Food Insecurity: No Food Insecurity (07/27/2021)   Hunger Vital Sign    Worried About Running Out of Food in the Last Year: Never true    Ran Out of Food in the Last Year: Never true  Transportation Needs: No Transportation Needs (07/27/2021)   PRAPARE - Hydrologist (Medical): No    Lack of Transportation (Non-Medical): No  Physical Activity: Insufficiently Active (07/27/2021)   Exercise Vital Sign    Days of Exercise per Week: 2 days    Minutes of Exercise per Session: 30 min  Stress: No Stress Concern Present (07/27/2021)   [place]    Feeling of Stress : Not at all  Social Connections: Socially Isolated (08/02/2021)   Social Connection and Isolation Panel [NHANES]    Frequency of Communication with Friends and Family: More than three times a week    Frequency of Social Gatherings with Friends and Family: Once a week    Attends Religious Services: Never    Marine scientist or Organizations: No    Attends Archivist Meetings: Never    Marital Status:  Widowed    Family History  Problem Relation Age of Onset   Breast cancer Sister    Cancer Sister    Diabetes Brother    Hypertension Mother    Miscarriages / Stillbirths Mother    Varicose Veins Mother    Cancer Paternal Grandmother    Obesity Paternal Grandmother     Health Maintenance  Topic Date Due   Pneumonia Vaccine 16+ Years old (1 - PCV) 04/06/1958   OPHTHALMOLOGY EXAM  11/03/2021 (Originally 04/06/1962)   COVID-19 Vaccine (5 - Pfizer risk series) 01/09/2022 (Originally 12/24/2019)   INFLUENZA VACCINE  04/09/2022 (Originally 08/09/2021)   Zoster Vaccines- Shingrix (1 of 2) 05/16/2022 (Originally 04/06/1971)   COLONOSCOPY (Pts 45-72yr Insurance coverage will need to be confirmed)  08/03/2022 (Originally 04/05/1997)    Hepatitis C Screening  08/03/2022 (Originally 04/06/1970)   MAMMOGRAM  09/18/2021   HEMOGLOBIN A1C  12/03/2021   Diabetic kidney evaluation - GFR measurement  08/04/2022   Diabetic kidney evaluation - Urine ACR  08/04/2022   FOOT EXAM  09/07/2022   TETANUS/TDAP  09/30/2025   DEXA SCAN  01/05/2030   HPV VACCINES  Aged Out     ----------------------------------------------------------------------------------------------------------------------------------------------------------------------------------------------------------------- Physical Exam BP (!) 151/81 (BP Location: Left Arm, Patient Position: Sitting, Cuff Size: Large)   Pulse 80   Ht 5' 5.5" (1.664 m)   Wt 220 lb (99.8 kg)   SpO2 94%   BMI 36.05 kg/m   Physical Exam Constitutional:      Appearance: Normal appearance.  Musculoskeletal:     Comments: Mild swelling with ttp along the distal radius.  There is some warmth without erythema.  Positive Finkelstein test.    Neurological:     Mental Status: She is alert.     ------------------------------------------------------------------------------------------------------------------------------------------------------------------------------------------------------------------- Assessment and Plan  De Quervain's tenosynovitis, left Provided with thumb spica wrist splint.  Recommend applying voltaren gel qid.  Adding medrol dosepak as well.  Checking uric acid. Let me know if not improving after 2-3 weeks.     Meds ordered this encounter  Medications   methylPREDNISolone (MEDROL DOSEPAK) 4 MG TBPK tablet    Sig: Taper as directed on packaging.    Dispense:  21 tablet    Refill:  0    No follow-ups on file.    This visit occurred during the SARS-CoV-2 public health emergency.  Safety protocols were in place, including screening questions prior to the visit, additional usage of staff PPE, and extensive cleaning of exam room while observing appropriate contact time  as indicated for disinfecting solutions.

## 2021-09-07 LAB — URIC ACID: Uric Acid, Serum: 5.7 mg/dL (ref 2.5–7.0)

## 2021-09-08 ENCOUNTER — Other Ambulatory Visit: Payer: Self-pay | Admitting: Family Medicine

## 2021-09-14 ENCOUNTER — Encounter: Payer: Self-pay | Admitting: Family Medicine

## 2021-09-16 NOTE — Telephone Encounter (Signed)
No ma'am there is not a release of information form for the [facility] in her documents. tvt

## 2021-10-08 ENCOUNTER — Other Ambulatory Visit: Payer: Self-pay | Admitting: Family Medicine

## 2021-10-20 ENCOUNTER — Other Ambulatory Visit: Payer: Self-pay | Admitting: Family Medicine

## 2021-10-21 LAB — HM DIABETES EYE EXAM

## 2021-11-01 ENCOUNTER — Ambulatory Visit
Admission: EM | Admit: 2021-11-01 | Discharge: 2021-11-01 | Disposition: A | Discharge status: Home / Self Care | Payer: Medicare Other | Attending: Family Medicine | Admitting: Family Medicine

## 2021-11-01 ENCOUNTER — Ambulatory Visit (INDEPENDENT_AMBULATORY_CARE_PROVIDER_SITE_OTHER): Payer: Medicare Other

## 2021-11-01 DIAGNOSIS — R079 Chest pain, unspecified: Secondary | ICD-10-CM

## 2021-11-01 DIAGNOSIS — R0789 Other chest pain: Secondary | ICD-10-CM

## 2021-11-01 DIAGNOSIS — R059 Cough, unspecified: Secondary | ICD-10-CM

## 2021-11-01 DIAGNOSIS — R051 Acute cough: Secondary | ICD-10-CM | POA: Diagnosis not present

## 2021-11-01 DIAGNOSIS — J069 Acute upper respiratory infection, unspecified: Secondary | ICD-10-CM

## 2021-11-01 MED ORDER — PREDNISONE 20 MG PO TABS: ORAL_TABLET | ORAL | 0 refills | Status: DC

## 2021-11-01 MED ORDER — BENZONATATE 200 MG PO CAPS: 200.0000 mg | ORAL_CAPSULE | Freq: Three times a day (TID) | ORAL | 0 refills | Status: DC | PRN

## 2021-11-01 MED ORDER — AMOXICILLIN-POT CLAVULANATE 875-125 MG PO TABS: 1.0000 | ORAL_TABLET | Freq: Two times a day (BID) | ORAL | 0 refills | Status: DC

## 2021-11-01 NOTE — ED Provider Notes (Addendum)
Jasmine Montgomery CARE    CSN: [id number] Arrival date & time: 11/01/21  1456      History   Chief Complaint Chief Complaint  Patient presents with   Cough    Chest pain when coughing. Has been going on for about 3-4 weeks. Will start to get better and then all of a sudden turn horrible again. - Entered by patient    HPI Jasmine Montgomery is a 71 y.o. female.   HPI Chong Sicilian has been sick for a month.  Coughing and chest congestion.  The last week she has developed increasing chest pain with deep breath.  Chest pain with cough.  Congestion.  She coughs a lot in the morning and is coughing up purulent sputum.  She also feels very tired.  Has not noted headache or body aches, sweats chills or fever.  COVID testing done early in the illness was negative.  No one else at home is sick Patient is a cigarette smoker.  She denies any underlying COPD or asthma Past Medical History:  Diagnosis Date   A-fib ([place])    Cancer ([place]) 08/2019   Diabetes mellitus without complication ([place])    History of breast cancer    Hyperlipidemia    Hypertension    Stroke [facility])     Patient Active Problem List   Diagnosis Date Noted   De Quervain's tenosynovitis, left 09/06/2021   Constipation 07/05/2020   Breast cancer ([place]) 04/04/2020   Acute bronchitis 04/04/2020   Hyperlipidemia associated with type 2 diabetes mellitus ([place]) 04/04/2020   S/P bilateral mastectomy 03/12/2020   History of COVID-19 03/04/2020   Paroxysmal atrial fibrillation ([place]) 12/12/2019   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      Home Medications    Prior to  Admission medications   Medication Sig Start Date End Date Taking? Authorizing Provider  amoxicillin-clavulanate (AUGMENTIN) 875-125 MG tablet Take 1 tablet by mouth every 12 (twelve) hours. 11/01/21  Yes Raylene Everts, MD  anastrozole (ARIMIDEX) 1 MG tablet Take 1 mg by mouth daily. 06/02/21  Yes [provider]  Ascorbic Acid (VITAMIN C PO) Take by mouth.   Yes [provider]  benzonatate (TESSALON) 200 MG capsule Take 1 capsule (200 mg total) by mouth 3 (three) times daily as needed for cough. 11/01/21  Yes Raylene Everts, MD  Cholecalciferol 25 MCG (1000 UT) tablet Take by mouth.   Yes [provider]  lisinopril (ZESTRIL) 10 MG tablet Take 1 tablet by mouth once daily 04/06/21  Yes Luetta Nutting, DO  metFORMIN (GLUCOPHAGE) 1000 MG tablet Take 1 tablet (1,000 mg total) by mouth 2 (two) times daily with a meal. 08/03/21  Yes Luetta Nutting, DO  predniSONE (DELTASONE) 20 MG tablet Take 2 pills daily with food 11/01/21  Yes Raylene Everts, MD  Semaglutide,0.25 or 0.'5MG'$ /DOS, (OZEMPIC, 0.25 OR 0.5 MG/DOSE,) 2 MG/3ML SOPN Start 0.'25mg'$  x4 weeks then increase to 0.'5mg'$ . 08/03/21  Yes Luetta Nutting, DO  simvastatin (ZOCOR) 20 MG tablet Take 1 tablet by mouth once daily 10/20/21  Yes Luetta Nutting, DO  VENTOLIN HFA 108 (90 Base) MCG/ACT  inhaler INHALE 1 TO 2 PUFFS BY MOUTH EVERY 4 HOURS AS NEEDED FOR WHEEZING AND FOR SHORTNESS OF BREATH 10/11/21  Yes Luetta Nutting, DO  VITAMIN A PO Take by mouth.   Yes [provider]  vitamin B-12 (CYANOCOBALAMIN) 500 MCG tablet Take by mouth.   Yes [provider]  VITAMIN E PO Take by mouth.   Yes [provider]  ondansetron (ZOFRAN) 4 MG tablet Take 4 mg by mouth every 8 (eight) hours as needed. 08/23/20   [provider]    Family History Family History  Problem Relation Age of Onset   Breast cancer Sister    Cancer Sister    Diabetes Brother    Hypertension Mother    Miscarriages /  [place] Mother    Varicose Veins Mother    Cancer Paternal Grandmother    Obesity Paternal Grandmother     Social History Social History   Tobacco Use   Smoking status: Every Day    Packs/day: 0.50    Years: 15.00    Total pack years: 7.50    Types: Cigarettes   Smokeless tobacco: Never  Vaping Use   Vaping Use: Never used  Substance Use Topics   Alcohol use: Not Currently   Drug use: Not Currently    Types: Marijuana     Allergies   Patient has no known allergies.   Review of Systems Review of Systems See HPI  Physical Exam Triage Vital Signs ED Triage Vitals  Enc Vitals Group     BP 11/01/21 1538 122/74     Pulse Rate 11/01/21 1538 85     Resp 11/01/21 1538 18     Temp 11/01/21 1538 99.1 F (37.3 C)     Temp Source 11/01/21 1538 Oral     SpO2 11/01/21 1538 96 %     Weight 11/01/21 1535 220 lb (99.8 kg)     Height 11/01/21 1535 5' 5.5" (1.664 m)     Head Circumference --      Peak Flow --      Pain Score 11/01/21 1534 0     Pain Loc --      Pain Edu? --      Excl. in [place]? --    No data found.  Updated Vital Signs BP 122/74 (BP Location: Left Arm)   Pulse 85   Temp 99.1 F (37.3 C) (Oral)   Resp 18   Ht 5' 5.5" (1.664 m)   Wt 99.8 kg   SpO2 96%   BMI 36.05 kg/m      Physical Exam Constitutional:      General: She is not in acute distress.    Appearance: She is well-developed.     Comments: Overweight.  Appears tired.  Appears ill  HENT:     Head: Normocephalic and atraumatic.     Right Ear: Tympanic membrane and ear canal normal.     Left Ear: Tympanic membrane and ear canal normal.     Nose: Nose normal.     Mouth/Throat:     Mouth: Mucous membranes are moist.     Pharynx: No posterior oropharyngeal erythema.  Eyes:     Conjunctiva/sclera: Conjunctivae normal.     Pupils: Pupils are equal, round, and reactive to light.  Cardiovascular:     Rate and Rhythm: Normal rate and regular rhythm.     Heart sounds: Normal heart sounds.   Pulmonary:     Effort: Pulmonary effort is normal. No respiratory  distress.     Comments: Breath sounds diminished in bases.  Port is palpable left anterior pectoralis region Abdominal:     General: There is no distension.     Palpations: Abdomen is soft.  Musculoskeletal:        General: Normal range of motion.     Cervical back: Normal range of motion.  Lymphadenopathy:     Cervical: No cervical adenopathy.  Skin:    General: Skin is warm and dry.  Neurological:     General: No focal deficit present.     Mental Status: She is alert.  Psychiatric:        Mood and Affect: Mood normal.        Behavior: Behavior normal.      UC Treatments / Results  Labs (all labs ordered are listed, but only abnormal results are displayed) Labs Reviewed - No data to display  EKG   Radiology DG Chest 2 View  Result Date: 11/01/2021 CLINICAL DATA:  Cough, chest pain EXAM: CHEST - 2 VIEW COMPARISON:  None Available. FINDINGS: Frontal and lateral views of the chest are obtained on 2 images. Left chest wall port tip within the region of the superior vena cava. Cardiac silhouette is unremarkable. No airspace disease, effusion, or pneumothorax. No acute bony abnormality. IMPRESSION: 1. No acute intrathoracic process. Electronically Signed   By: Randa Ngo M.D.   On: 11/01/2021 16:15    Procedures Procedures (including critical care time)  Medications Ordered in UC Medications - No data to display  Initial Impression / Assessment and Plan / UC Course  I have reviewed the triage vital signs and the nursing notes.  Pertinent labs & imaging results that were available during my care of the patient were reviewed by me and considered in my medical decision making (see chart for details).     Chest x-ray is normal.  Concern for cough in 4 weeks and smoker.  We will treat with antibiotics and prednisone.  Follow-up with PCP Final Clinical Impressions(s) / UC Diagnoses   Final diagnoses:   Acute cough  Acute upper respiratory infection  Acute chest wall pain     Discharge Instructions      Drink lots of water Take the antibiotic 2 times a day Take prednisone once a day with food.  The prednisone will help with your chest pain Take Tessalon 2-3 times a day for cough See your doctor if not improving towards the end of the week     ED Prescriptions     Medication Sig Dispense Auth. Provider   amoxicillin-clavulanate (AUGMENTIN) 875-125 MG tablet Take 1 tablet by mouth every 12 (twelve) hours. 14 tablet Raylene Everts, MD   predniSONE (DELTASONE) 20 MG tablet Take 2 pills daily with food 10 tablet Raylene Everts, MD   benzonatate (TESSALON) 200 MG capsule Take 1 capsule (200 mg total) by mouth 3 (three) times daily as needed for cough. 21 capsule Raylene Everts, MD      PDMP not reviewed this encounter.   Raylene Everts, MD 11/01/21 1657    Raylene Everts, MD 11/01/21 [phone]

## 2021-11-01 NOTE — ED Triage Notes (Signed)
Pt states that she has a cough and some chest congestion. X1 month  Pt states that she has some back pain when she takes a deep breath.

## 2021-11-01 NOTE — Discharge Instructions (Signed)
Drink lots of water Take the antibiotic 2 times a day Take prednisone once a day with food.  The prednisone will help with your chest pain Take Tessalon 2-3 times a day for cough See your doctor if not improving towards the end of the week

## 2021-11-07 ENCOUNTER — Ambulatory Visit (INDEPENDENT_AMBULATORY_CARE_PROVIDER_SITE_OTHER): Payer: Medicare Other | Admitting: Family Medicine

## 2021-11-07 ENCOUNTER — Encounter: Payer: Self-pay | Admitting: Family Medicine

## 2021-11-07 VITALS — BP 128/73 | HR 123 | Ht 65.5 in | Wt 226.0 lb

## 2021-11-07 DIAGNOSIS — I10 Essential (primary) hypertension: Secondary | ICD-10-CM

## 2021-11-07 DIAGNOSIS — E119 Type 2 diabetes mellitus without complications: Secondary | ICD-10-CM | POA: Diagnosis not present

## 2021-11-07 LAB — POCT GLYCOSYLATED HEMOGLOBIN (HGB A1C): HbA1c, POC (controlled diabetic range): 7.8 % — AB (ref 0.0–7.0)

## 2021-11-07 NOTE — Patient Instructions (Signed)
Let me know if you continue to have issues with the Ozempic.

## 2021-11-07 NOTE — Assessment & Plan Note (Signed)
BP remains well controlled.  Low sodium diet encouraged.

## 2021-11-07 NOTE — Assessment & Plan Note (Signed)
A1c has increased since last visit.  We reviewed why her ozempic pen may be leaking.  Went over [employer] with her.  She is not removing second cap to expose pen needle.  She will correct this on her injection today.  Encouraged to continue to work on dietary changes.

## 2021-11-07 NOTE — Progress Notes (Signed)
Jasmine Montgomery - 71 y.o. female MRN [id number]  Date of birth: 03/01/1950  Subjective Chief Complaint  Patient presents with   URI   Diabetes    HPI Jasmine Montgomery is a 71 y.o. female here today for follow up visit.   She reports that she is getting over a respiratory infection.  Remains on augmentin and is finishing prednisone.  She continues on metformin and ozempic for management of diabetes.  She reports that her ozempic pen is leaking when she tries to do injections.  She is tolerating current medications well.   A1c has increased since last visit.  Doing well with simvastatin for concurrent HLD.     Remains on anastrazole due to history of breast cancer.  ROS:  A comprehensive ROS was completed and negative except as noted per HPI  No Known Allergies  Past Medical History:  Diagnosis Date   A-fib ([place])    Cancer ([place]) 08/2019   Diabetes mellitus without complication ([place])    History of breast cancer    Hyperlipidemia    Hypertension    Stroke [facility])     Past Surgical History:  Procedure Laterality Date   BREAST SURGERY  03/2020   HERNIA REPAIR  2010   SMALL INTESTINE SURGERY  03/2016    Social History   Socioeconomic History   Marital status: Widowed    Spouse name: Not on file   Number of children: 3   Years of education: 14   Highest education level: Associate degree: academic program  Occupational History   Occupation: Retired    Comment: retired Marine scientist  Tobacco Use   Smoking status: Every Day    Packs/day: 0.50    Years: 15.00    Total pack years: 7.50    Types: Cigarettes   Smokeless tobacco: Never  Vaping Use   Vaping Use: Never used  Substance and Sexual Activity   Alcohol use: Not Currently   Drug use: Not Currently    Types: Marijuana   Sexual activity: Not Currently    Partners: Female, Female    Birth control/protection: Post-menopausal  Other Topics Concern   Not on file  Social History Narrative   Lives with her daughter and her  family. She enjoys quilting and painting pillow cases.   Social Determinants of Health   Financial Resource Strain: Low Risk  (07/27/2021)   Overall Financial Resource Strain (CARDIA)    Difficulty of Paying Living Expenses: Not hard at all  Food Insecurity: No Food Insecurity (07/27/2021)   Hunger Vital Sign    Worried About Running Out of Food in the Last Year: Never true    Ran Out of Food in the Last Year: Never true  Transportation Needs: No Transportation Needs (07/27/2021)   PRAPARE - Hydrologist (Medical): No    Lack of Transportation (Non-Medical): No  Physical Activity: Insufficiently Active (07/27/2021)   Exercise Vital Sign    Days of Exercise per Week: 2 days    Minutes of Exercise per Session: 30 min  Stress: No Stress Concern Present (07/27/2021)   [place]    Feeling of Stress : Not at all  Social Connections: Socially Isolated (08/02/2021)   Social Connection and Isolation Panel [NHANES]    Frequency of Communication with Friends and Family: More than three times a week    Frequency of Social Gatherings with Friends and Family: Once a week    Attends Religious  Services: Never    Active Member of Clubs or Organizations: No    Attends Archivist Meetings: Never    Marital Status: Widowed    Family History  Problem Relation Age of Onset   Breast cancer Sister    Cancer Sister    Diabetes Brother    Hypertension Mother    Miscarriages / Stillbirths Mother    Varicose Veins Mother    Cancer Paternal Grandmother    Obesity Paternal Grandmother     Health Maintenance  Topic Date Due   OPHTHALMOLOGY EXAM  Never done   MAMMOGRAM  09/18/2021   COVID-19 Vaccine (5 - Pfizer risk series) 01/09/2022 (Originally 12/24/2019)   INFLUENZA VACCINE  04/09/2022 (Originally 08/09/2021)   Zoster Vaccines- Shingrix (1 of 2) 05/16/2022 (Originally 04/06/1971)   COLONOSCOPY (Pts  45-14yr Insurance coverage will need to be confirmed)  08/03/2022 (Originally 04/05/1997)   Hepatitis C Screening  08/03/2022 (Originally 04/06/1970)   HEMOGLOBIN A1C  05/09/2022   Medicare Annual Wellness (AWV)  08/03/2022   Diabetic kidney evaluation - GFR measurement  08/04/2022   Diabetic kidney evaluation - Urine ACR  08/04/2022   FOOT EXAM  09/07/2022   TETANUS/TDAP  09/30/2025   DEXA SCAN  01/05/2030   Pneumonia Vaccine 71 Years old  Completed   HPV VACCINES  Aged Out     ----------------------------------------------------------------------------------------------------------------------------------------------------------------------------------------------------------------- Physical Exam BP 128/73   Pulse (!) 123   Ht 5' 5.5" (1.664 m)   Wt 226 lb (102.5 kg)   SpO2 97%   BMI 37.04 kg/m   Physical Exam  ------------------------------------------------------------------------------------------------------------------------------------------------------------------------------------------------------------------- Assessment and Plan  Type 2 diabetes mellitus without complication, without long-term current use of insulin (HCC) A1c has increased since last visit.  We reviewed why her ozempic pen may be leaking.  [employer].  She is not removing second cap to expose pen needle.  She will correct this on her injection today.  Encouraged to continue to work on dietary changes.   Essential hypertension BP remains well controlled.  Low sodium diet encouraged.    No orders of the defined types were placed in this encounter.   Return in about 3 months (around 02/07/2022) for T2DM/HTN.  30 minutes spent including pre visit preparation, review of prior notes and labs, encounter with patient and same day documentation.  This visit occurred during the SARS-CoV-2 public health emergency.  Safety protocols were in place, including screening questions prior to the  visit, additional usage of staff PPE, and extensive cleaning of exam room while observing appropriate contact time as indicated for disinfecting solutions.

## 2021-11-16 ENCOUNTER — Other Ambulatory Visit: Payer: Self-pay | Admitting: Family Medicine

## 2021-12-12 ENCOUNTER — Other Ambulatory Visit: Payer: Self-pay | Admitting: Family Medicine

## 2021-12-19 ENCOUNTER — Other Ambulatory Visit: Payer: Self-pay | Admitting: Family Medicine

## 2021-12-19 MED ORDER — OSELTAMIVIR PHOSPHATE 75 MG PO CAPS: 75.0000 mg | ORAL_CAPSULE | Freq: Every day | ORAL | 0 refills | Status: AC

## 2021-12-25 ENCOUNTER — Other Ambulatory Visit: Payer: Self-pay | Admitting: Family Medicine

## 2022-01-10 IMAGING — DX DG ABDOMEN 1V
2 series · 2 of 2 positions shown · non-contrast
Comparison: None.

CLINICAL DATA: Constipation and abdominal pain

EXAM:
ABDOMEN - 1 VIEW

[abdomen kub (1 of 2)]
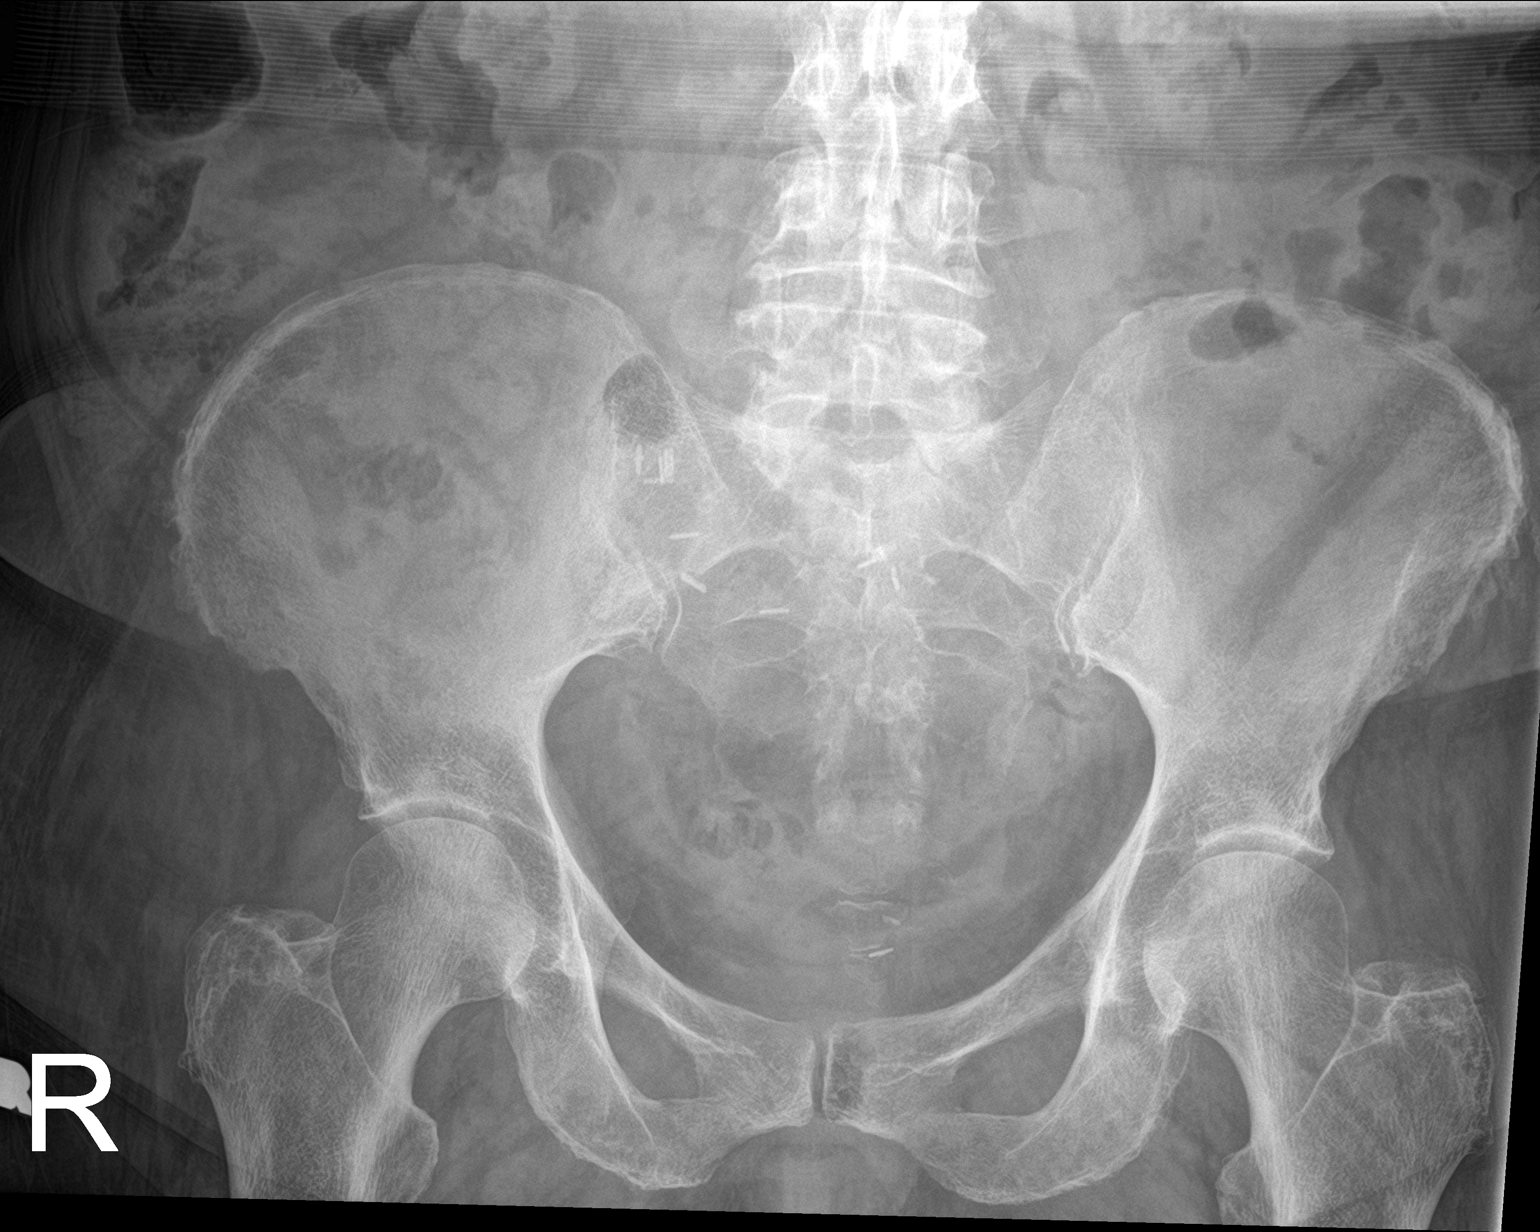

[abdomen kub (2 of 2)]
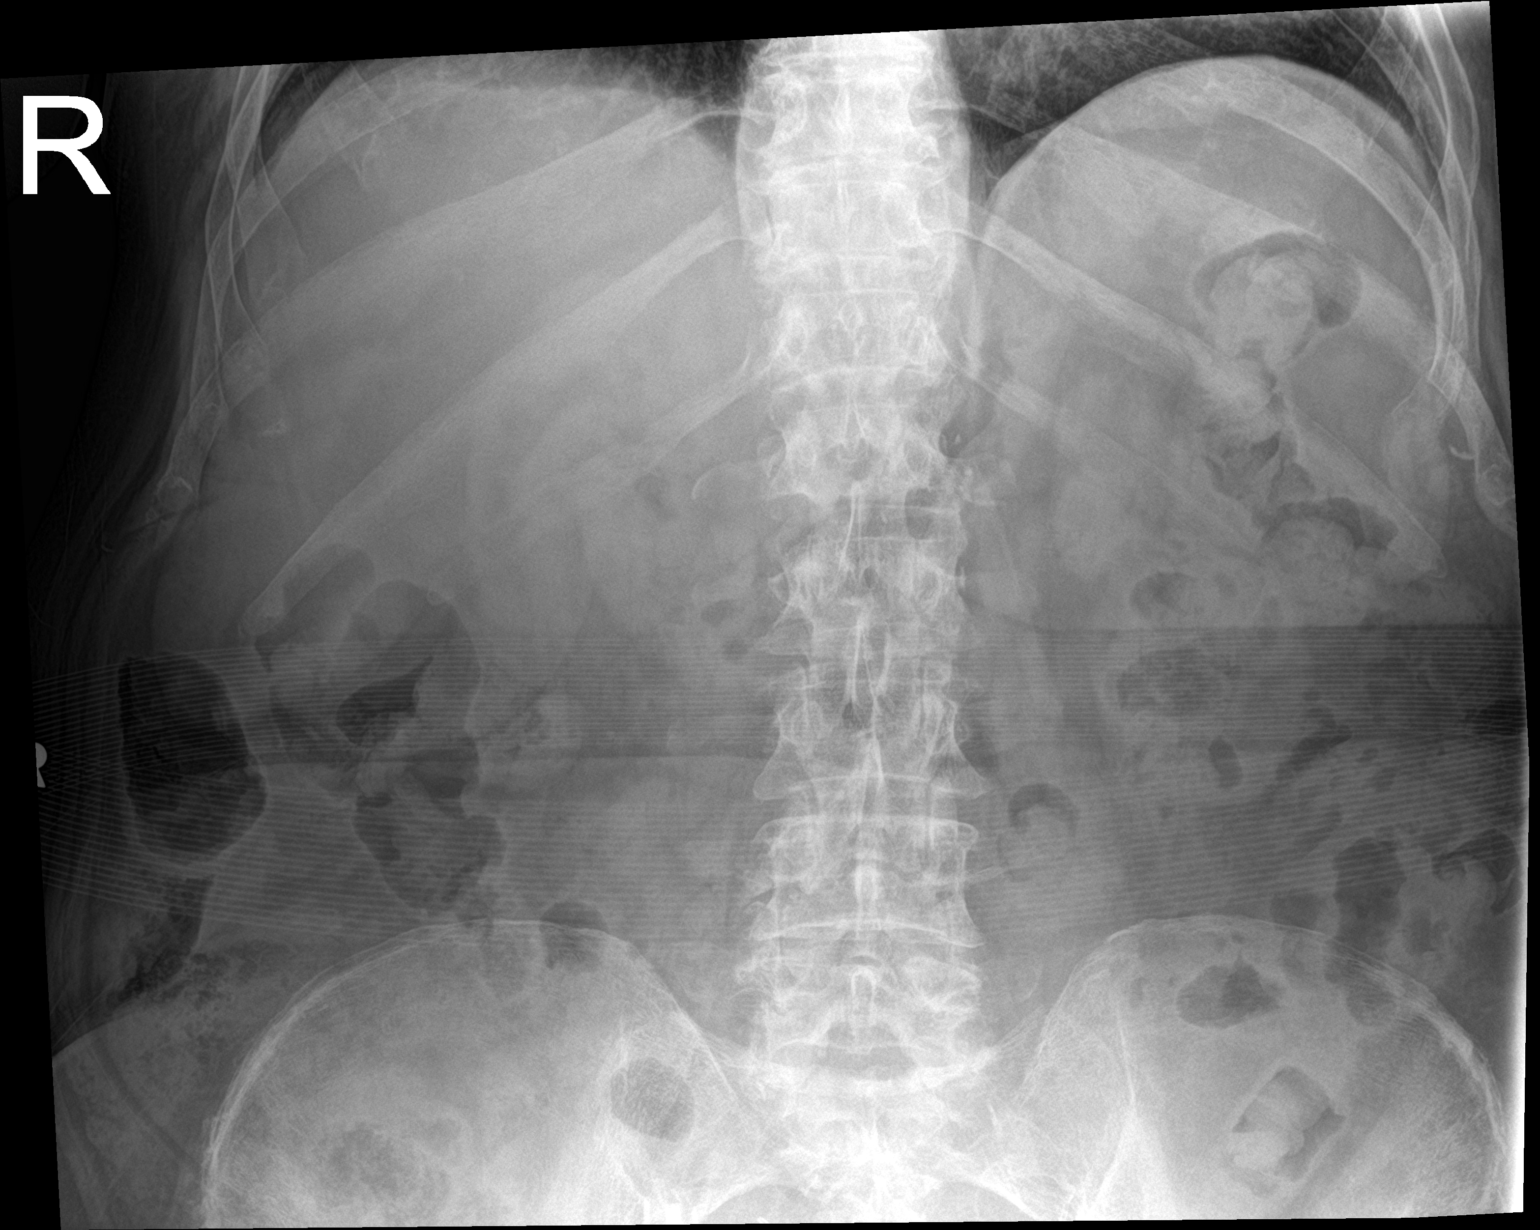

[2 of 2 positions shown; findings below may reference images not displayed]

FINDINGS: There is diffuse stool throughout colon. There is no bowel
dilatation or air-fluid level to suggest bowel obstruction. No free
air. There are surgical clips in the pelvis. Visualized lung bases
are clear.
IMPRESSION: Diffuse stool throughout colon, likely indicative of a degree of
constipation. No evident bowel obstruction or free air on supine
examination. Visualized lung bases clear.

## 2022-01-13 ENCOUNTER — Other Ambulatory Visit: Payer: Self-pay | Admitting: Family Medicine

## 2022-01-26 ENCOUNTER — Other Ambulatory Visit: Payer: Self-pay | Admitting: Family Medicine

## 2022-02-07 ENCOUNTER — Encounter: Payer: Self-pay | Admitting: Family Medicine

## 2022-02-07 ENCOUNTER — Ambulatory Visit (INDEPENDENT_AMBULATORY_CARE_PROVIDER_SITE_OTHER): Payer: Medicare Other | Admitting: Family Medicine

## 2022-02-07 VITALS — BP 138/75 | HR 82 | Ht 65.5 in | Wt 223.0 lb

## 2022-02-07 DIAGNOSIS — M25532 Pain in left wrist: Secondary | ICD-10-CM

## 2022-02-07 DIAGNOSIS — M79642 Pain in left hand: Secondary | ICD-10-CM | POA: Diagnosis not present

## 2022-02-07 DIAGNOSIS — E119 Type 2 diabetes mellitus without complications: Secondary | ICD-10-CM

## 2022-02-07 DIAGNOSIS — I10 Essential (primary) hypertension: Secondary | ICD-10-CM | POA: Diagnosis not present

## 2022-02-07 DIAGNOSIS — M654 Radial styloid tenosynovitis [de Quervain]: Secondary | ICD-10-CM

## 2022-02-07 LAB — POCT GLYCOSYLATED HEMOGLOBIN (HGB A1C): HbA1c, POC (controlled diabetic range): 7.4 % — AB (ref 0.0–7.0)

## 2022-02-07 NOTE — Assessment & Plan Note (Signed)
Blood pressure remains fairly well-controlled.  Recommend continuation of lisinopril at current strength.

## 2022-02-07 NOTE — Assessment & Plan Note (Signed)
She is planning on discontinuing Ozempic due to feeling of fatigue after injection.  Encourage dietary changes to help with blood sugar control.  Will plan to see her back in 3 months.  If A1c is trending back up we discussed considering Mounjaro.

## 2022-02-07 NOTE — Progress Notes (Signed)
Jasmine Montgomery - 72 y.o. female MRN [id number]  Date of birth: [birth year]  Subjective Chief Complaint  Patient presents with   Hypertension   Diabetes    HPI Jasmine Montgomery is a 72 year old female here today for follow-up visit.  She reports overall she is doing well.  She is having some continued pain in her left wrist.  Requesting referral to Occupational Therapy to help with this.  She has tried bracing and anti-inflammatories.  She continues on Ozempic and metformin for management of her diabetes.  She reports she has had fatigue after taking Ozempic and would like to try off of this.  Blood sugars have been somewhat improved with this.  Remains on simvastatin for coexisting hyperlipidemia.  Tolerating this well.  Continues on lisinopril for management of hypertension.  Blood pressure remains well-controlled.  She has not had any side effects from medication administration.  She denies chest pain, shortness of breath, palpitations, headaches or vision changes.  ROS:  A comprehensive ROS was completed and negative except as noted per HPI  No Known Allergies  Past Medical History:  Diagnosis Date   A-fib ([place])    Cancer ([place]) 08/2019   Diabetes mellitus without complication ([place])    History of breast cancer    Hyperlipidemia    Hypertension    Stroke [facility])     Past Surgical History:  Procedure Laterality Date   BREAST SURGERY  03/2020   HERNIA REPAIR  2010   SMALL INTESTINE SURGERY  03/2016    Social History   Socioeconomic History   Marital status: Widowed    Spouse name: Not on file   Number of children: 3   Years of education: 14   Highest education level: Associate degree: academic program  Occupational History   Occupation: Retired    Comment: retired Marine scientist  Tobacco Use   Smoking status: Every Day    Packs/day: 0.50    Years: 15.00    Total pack years: 7.50    Types: Cigarettes   Smokeless tobacco: Never  Vaping Use   Vaping Use: Never used   Substance and Sexual Activity   Alcohol use: Not Currently   Drug use: Not Currently    Types: Marijuana   Sexual activity: Not Currently    Partners: Female, Female    Birth control/protection: Post-menopausal  Other Topics Concern   Not on file  Social History Narrative   Lives with her daughter and her family. She enjoys quilting and painting pillow cases.   Social Determinants of Health   Financial Resource Strain: Low Risk  (07/27/2021)   Overall Financial Resource Strain (CARDIA)    Difficulty of Paying Living Expenses: Not hard at all  Food Insecurity: No Food Insecurity (07/27/2021)   Hunger Vital Sign    Worried About Running Out of Food in the Last Year: Never true    Ran Out of Food in the Last Year: Never true  Transportation Needs: No Transportation Needs (07/27/2021)   PRAPARE - Hydrologist (Medical): No    Lack of Transportation (Non-Medical): No  Physical Activity: Insufficiently Active (07/27/2021)   Exercise Vital Sign    Days of Exercise per Week: 2 days    Minutes of Exercise per Session: 30 min  Stress: No Stress Concern Present (07/27/2021)   [place]    Feeling of Stress : Not at all  Social Connections: Socially Isolated (08/02/2021)   Social Connection  and Isolation Panel [NHANES]    Frequency of Communication with Friends and Family: More than three times a week    Frequency of Social Gatherings with Friends and Family: Once a week    Attends Religious Services: Never    Marine scientist or Organizations: No    Attends Archivist Meetings: Never    Marital Status: Widowed    Family History  Problem Relation Age of Onset   Breast cancer Sister    Cancer Sister    Diabetes Brother    Hypertension Mother    Miscarriages / Stillbirths Mother    Varicose Veins Mother    Cancer Paternal Grandmother    Obesity Paternal Grandmother      Health Maintenance  Topic Date Due   INFLUENZA VACCINE  04/09/2022 (Originally 08/09/2021)   Zoster Vaccines- Shingrix (1 of 2) 05/16/2022 (Originally 04/06/1971)   COLONOSCOPY (Pts 45-90yr Insurance coverage will need to be confirmed)  08/03/2022 (Originally 04/05/1997)   Hepatitis C Screening  08/03/2022 (Originally 04/06/1970)   MAMMOGRAM  11/08/2022 (Originally 09/18/2021)   COVID-19 Vaccine (5 - 2023-24 season) 02/24/2023 (Originally 09/09/2021)   Medicare Annual Wellness (AWV)  08/03/2022   Diabetic kidney evaluation - eGFR measurement  08/04/2022   Diabetic kidney evaluation - Urine ACR  08/04/2022   HEMOGLOBIN A1C  08/08/2022   FOOT EXAM  09/07/2022   OPHTHALMOLOGY EXAM  10/22/2022   DTaP/Tdap/Td (3 - Td or Tdap) 09/30/2025   DEXA SCAN  01/05/2030   Pneumonia Vaccine 70 Years old  Completed   HPV VACCINES  Aged Out     ----------------------------------------------------------------------------------------------------------------------------------------------------------------------------------------------------------------- Physical Exam BP 138/75 (BP Location: Left Arm, Patient Position: Sitting, Cuff Size: Large)   Pulse 82   Ht 5' 5.5" (1.664 m)   Wt 223 lb (101.2 kg)   SpO2 96%   BMI 36.54 kg/m   Physical Exam Constitutional:      Appearance: Normal appearance.  HENT:     Head: Normocephalic and atraumatic.  Eyes:     General: No scleral icterus. Cardiovascular:     Rate and Rhythm: Normal rate and regular rhythm.  Pulmonary:     Effort: Pulmonary effort is normal.     Breath sounds: Normal breath sounds.  Neurological:     Mental Status: She is alert.  Psychiatric:        Mood and Affect: Mood normal.        Behavior: Behavior normal.      ------------------------------------------------------------------------------------------------------------------------------------------------------------------------------------------------------------------- Assessment and Plan  Essential hypertension Blood pressure remains fairly well-controlled.  Recommend continuation of lisinopril at current strength.  Type 2 diabetes mellitus without complication, without long-term current use of insulin [place] She is planning on discontinuing Ozempic due to feeling of fatigue after injection.  Encourage dietary changes to help with blood sugar control.  Will plan to see her back in 3 months.  If A1c is trending back up we discussed considering Mounjaro.  De Quervain's tenosynovitis, left Referral to Occupational Therapy.  Discussed that if not improving we can consider injection.   No orders of the defined types were placed in this encounter.   Return in about 3 months (around 05/09/2022) for f/u T2DM.    This visit occurred during the SARS-CoV-2 public health emergency.  Safety protocols were in place, including screening questions prior to the visit, additional usage of staff PPE, and extensive cleaning of exam room while observing appropriate contact time as indicated for disinfecting solutions.

## 2022-02-07 NOTE — Assessment & Plan Note (Signed)
Referral to Occupational Therapy.  Discussed that if not improving we can consider injection.

## 2022-02-08 ENCOUNTER — Other Ambulatory Visit: Payer: Self-pay | Admitting: Family Medicine

## 2022-02-10 ENCOUNTER — Other Ambulatory Visit: Payer: Self-pay | Admitting: Family Medicine

## 2022-03-20 ENCOUNTER — Telehealth: Payer: Self-pay | Admitting: General Practice

## 2022-03-20 DIAGNOSIS — M25532 Pain in left wrist: Secondary | ICD-10-CM | POA: Insufficient documentation

## 2022-03-20 NOTE — Transitions of Care (Post Inpatient/ED Visit) (Signed)
   03/20/2022  Name: Jasmine Montgomery MRN: [id number] DOB: October 30, 1950  Today's TOC FU Call Status: Today's TOC FU Call Status:: Successful TOC FU Call Competed TOC FU Call Complete Date: 03/20/22  Transition Care Management Follow-up Telephone Call Date of Discharge: 03/19/22 Discharge Facility: Other ([place]) Name of Other ([place]) Discharge Facility: [facility] Type of Discharge: Emergency Department Reason for ED Visit: Other: (fall) How have you been since you were released from the hospital?: Better Any questions or concerns?: No  Items Reviewed: Did you receive and understand the discharge instructions provided?: Yes Medications obtained and verified?: Yes (Medications Reviewed) Any new allergies since your discharge?: No Dietary orders reviewed?: No Do you have support at home?: Yes  Home Care and Equipment/Supplies: [place] Ordered?: No Any new equipment or medical supplies ordered?: No  Functional Questionnaire: Do you need assistance with bathing/showering or dressing?: No Do you need assistance with meal preparation?: No Do you need assistance with eating?: No Do you have difficulty maintaining continence: No Do you need assistance with getting out of bed/getting out of a chair/moving?: No Do you have difficulty managing or taking your medications?: No  Folllow up appointments reviewed: PCP Follow-up appointment confirmed?: Yes Date of PCP follow-up appointment?: 03/29/22 Follow-up Provider: Dr. Zigmund Daniel Specialist [facility] Follow-up appointment confirmed?: NA Do you need transportation to your follow-up appointment?: No Do you understand care options if your condition(s) worsen?: Yes-patient verbalized understanding    SIGNATURE: Tinnie Gens, RN BSN

## 2022-03-28 ENCOUNTER — Other Ambulatory Visit: Payer: Self-pay | Admitting: Family Medicine

## 2022-03-29 ENCOUNTER — Ambulatory Visit (INDEPENDENT_AMBULATORY_CARE_PROVIDER_SITE_OTHER): Payer: Medicare Other | Admitting: Family Medicine

## 2022-03-29 ENCOUNTER — Ambulatory Visit: Payer: Medicare Other | Attending: Family Medicine

## 2022-03-29 ENCOUNTER — Other Ambulatory Visit: Payer: Self-pay | Admitting: Family Medicine

## 2022-03-29 ENCOUNTER — Encounter: Payer: Self-pay | Admitting: Family Medicine

## 2022-03-29 VITALS — BP 139/83 | HR 81 | Ht 65.5 in | Wt 230.0 lb

## 2022-03-29 DIAGNOSIS — R55 Syncope and collapse: Secondary | ICD-10-CM | POA: Diagnosis not present

## 2022-03-29 DIAGNOSIS — I10 Essential (primary) hypertension: Secondary | ICD-10-CM

## 2022-03-29 NOTE — Progress Notes (Unsigned)
Enrolled for [employer] to mail a ZIO XT long term holter monitor to the patients address on file.   DOD to read. 

## 2022-04-01 DIAGNOSIS — R55 Syncope and collapse: Secondary | ICD-10-CM | POA: Insufficient documentation

## 2022-04-01 NOTE — Assessment & Plan Note (Signed)
Unclear if she had prior syncopal episode.  Lab work and imaging reassuring in the ED.  No further symptoms since initial incident.  Did discuss doing a Zio patch x 7 days to evaluate for any other arrhythmias.

## 2022-04-01 NOTE — Assessment & Plan Note (Signed)
Blood pressure is fairly well-controlled at this time.  Recommend continuation of current medications for management of hypertension. 

## 2022-04-01 NOTE — Progress Notes (Signed)
Jasmine Montgomery - 72 y.o. female MRN [id number]  Date of birth: Nov 24, 1950  Subjective Chief Complaint  Patient presents with   Fall   Hypertension    HPI Jasmine Montgomery is a 72 year old female here today for follow-up of recent ED visit.  She reports she was getting out of bed 1 evening and felt like everything flipped upside down to the ground.  She does not recall what specifically happened.  Unsure if there was loss of consciousness.  Her daughter did come upstairs shortly afterwards and found her on the ground.  No seizure activity noted.  No injuries noted.  No recent medication changes.  Blood pressure was elevated in the ED at 200/100.  Imaging of head and C-spine were negative.  No significant lab abnormalities.  Hypotension seem to resolve on its own without any intervention.  She is here today for follow-up.  She reports she has continued to feel well.  She has not had any palpitations, chest pain, dizziness.  Her blood pressure has been well-controlled at home.  ROS:  A comprehensive ROS was completed and negative except as noted per HPI  No Known Allergies  Past Medical History:  Diagnosis Date   A-fib ([place])    Cancer ([place]) 08/2019   Diabetes mellitus without complication ([place])    History of breast cancer    Hyperlipidemia    Hypertension    Stroke [facility])     Past Surgical History:  Procedure Laterality Date   BREAST SURGERY  03/2020   HERNIA REPAIR  2010   SMALL INTESTINE SURGERY  03/2016    Social History   Socioeconomic History   Marital status: Widowed    Spouse name: Not on file   Number of children: 3   Years of education: 14   Highest education level: Associate degree: academic program  Occupational History   Occupation: Retired    Comment: retired Marine scientist  Tobacco Use   Smoking status: Every Day    Packs/day: 0.50    Years: 15.00    Additional pack years: 0.00    Total pack years: 7.50    Types: Cigarettes   Smokeless tobacco: Never  Vaping Use    Vaping Use: Never used  Substance and Sexual Activity   Alcohol use: Not Currently   Drug use: Not Currently    Types: Marijuana   Sexual activity: Not Currently    Partners: Female, Female    Birth control/protection: Post-menopausal  Other Topics Concern   Not on file  Social History Narrative   Lives with her daughter and her family. She enjoys quilting and painting pillow cases.   Social Determinants of Health   Financial Resource Strain: Low Risk  (07/27/2021)   Overall Financial Resource Strain (CARDIA)    Difficulty of Paying Living Expenses: Not hard at all  Food Insecurity: No Food Insecurity (07/27/2021)   Hunger Vital Sign    Worried About Running Out of Food in the Last Year: Never true    Ran Out of Food in the Last Year: Never true  Transportation Needs: No Transportation Needs (07/27/2021)   PRAPARE - Hydrologist (Medical): No    Lack of Transportation (Non-Medical): No  Physical Activity: Insufficiently Active (07/27/2021)   Exercise Vital Sign    Days of Exercise per Week: 2 days    Minutes of Exercise per Session: 30 min  Stress: No Stress Concern Present (07/27/2021)   [place]  Feeling of Stress : Not at all  Social Connections: Socially Isolated (08/02/2021)   Social Connection and Isolation Panel [NHANES]    Frequency of Communication with Friends and Family: More than three times a week    Frequency of Social Gatherings with Friends and Family: Once a week    Attends Religious Services: Never    Marine scientist or Organizations: No    Attends Archivist Meetings: Never    Marital Status: Widowed    Family History  Problem Relation Age of Onset   Breast cancer Sister    Cancer Sister    Diabetes Brother    Hypertension Mother    Miscarriages / Stillbirths Mother    Varicose Veins Mother    Cancer Paternal Grandmother    Obesity Paternal  Grandmother     Health Maintenance  Topic Date Due   INFLUENZA VACCINE  04/09/2022 (Originally 08/09/2021)   Zoster Vaccines- Shingrix (1 of 2) 05/16/2022 (Originally 04/06/1971)   COLONOSCOPY (Pts 45-34yrs Insurance coverage will need to be confirmed)  08/03/2022 (Originally 04/05/1997)   Hepatitis C Screening  08/03/2022 (Originally 04/06/1970)   MAMMOGRAM  11/08/2022 (Originally 09/18/2021)   COVID-19 Vaccine (5 - 2023-24 season) 02/24/2023 (Originally 09/09/2021)   Medicare Annual Wellness (AWV)  08/03/2022   Diabetic kidney evaluation - eGFR measurement  08/04/2022   Diabetic kidney evaluation - Urine ACR  08/04/2022   HEMOGLOBIN A1C  08/08/2022   FOOT EXAM  09/07/2022   OPHTHALMOLOGY EXAM  10/22/2022   DTaP/Tdap/Td (3 - Td or Tdap) 09/30/2025   DEXA SCAN  01/05/2030   Pneumonia Vaccine 81+ Years old  Completed   HPV VACCINES  Aged Out     ----------------------------------------------------------------------------------------------------------------------------------------------------------------------------------------------------------------- Physical Exam BP 139/83 (BP Location: Left Arm, Patient Position: Sitting, Cuff Size: Large)   Pulse 81   Ht 5' 5.5" (1.664 m)   Wt 230 lb (104.3 kg)   SpO2 97%   BMI 37.69 kg/m   Physical Exam Constitutional:      Appearance: Normal appearance.  HENT:     Head: Normocephalic and atraumatic.  Eyes:     General: No scleral icterus. Cardiovascular:     Rate and Rhythm: Normal rate and regular rhythm.  Pulmonary:     Effort: Pulmonary effort is normal.     Breath sounds: Normal breath sounds.  Neurological:     Mental Status: She is alert.  Psychiatric:        Mood and Affect: Mood normal.        Behavior: Behavior normal.      ------------------------------------------------------------------------------------------------------------------------------------------------------------------------------------------------------------------- Assessment and Plan  Essential hypertension Blood pressure is fairly well-controlled at this time.  Recommend continuation of current medications for management of hypertension.  Pre-syncope Unclear if she had prior syncopal episode.  Lab work and imaging reassuring in the ED.  No further symptoms since initial incident.  Did discuss doing a Zio patch x 7 days to evaluate for any other arrhythmias.   No orders of the defined types were placed in this encounter.   No follow-ups on file.    This visit occurred during the SARS-CoV-2 public health emergency.  Safety protocols were in place, including screening questions prior to the visit, additional usage of staff PPE, and extensive cleaning of exam room while observing appropriate contact time as indicated for disinfecting solutions. ]

## 2022-04-04 DIAGNOSIS — R55 Syncope and collapse: Secondary | ICD-10-CM | POA: Diagnosis not present

## 2022-04-18 ENCOUNTER — Other Ambulatory Visit: Payer: Self-pay | Admitting: Family Medicine

## 2022-04-18 ENCOUNTER — Encounter: Payer: Self-pay | Admitting: Family Medicine

## 2022-04-18 DIAGNOSIS — M25532 Pain in left wrist: Secondary | ICD-10-CM

## 2022-04-18 NOTE — Telephone Encounter (Signed)
We have tried conservative therapy, I would recommend having her see Dr. Benjamin Stain at this point for evaluation of injection to the area.  I'll put in orders for xrays.    Thanks!  CM

## 2022-04-19 ENCOUNTER — Ambulatory Visit (INDEPENDENT_AMBULATORY_CARE_PROVIDER_SITE_OTHER): Payer: Medicare Other

## 2022-04-19 DIAGNOSIS — M25532 Pain in left wrist: Secondary | ICD-10-CM

## 2022-04-19 NOTE — Telephone Encounter (Signed)
Patient scheduled with Dr Thekkekandam.  

## 2022-04-21 ENCOUNTER — Ambulatory Visit (INDEPENDENT_AMBULATORY_CARE_PROVIDER_SITE_OTHER): Payer: Medicare Other | Admitting: Sports Medicine

## 2022-04-21 ENCOUNTER — Other Ambulatory Visit (INDEPENDENT_AMBULATORY_CARE_PROVIDER_SITE_OTHER): Payer: Medicare Other

## 2022-04-21 DIAGNOSIS — M654 Radial styloid tenosynovitis [de Quervain]: Secondary | ICD-10-CM

## 2022-04-21 NOTE — Assessment & Plan Note (Signed)
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 On exam she does have severe pain first extensor compartment at the level of the radial styloid process, positive Finkelstein sign. I think there are 2 pain generators but the dominant pain generator is the first extensor compartment. Today we did a first extensor compartment injection, go back to wrist sleeve, adding de Quervain's conditioning, return to see me in about 1 month to 6 weeks and if persistent pain we will try a thumb basal joint versus triscaphe joint injection.

## 2022-04-21 NOTE — Progress Notes (Signed)
    Procedures performed today:    Procedure: Real-time Ultrasound Guided injection of the left first extensor compartment Device: Samsung HS60  Verbal informed consent obtained.  Time-out conducted.  Noted no overlying erythema, induration, or other signs of local infection.  Skin prepped in a sterile fashion.  Local anesthesia: Topical Ethyl chloride.  With sterile technique and under real time ultrasound guidance: Noted extensor compartment effusion, 1 cc Kenalog 40, 1 cc lidocaine, 1cc bupivacaine injected easily Completed without difficulty  Advised to call if fevers/chills, erythema, induration, drainage, or persistent bleeding.  Images permanently stored and available for review in PACS.  Impression: Technically successful ultrasound guided injection.  Independent interpretation of notes and tests performed by another provider:   None.  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 On exam she does have severe pain first extensor compartment at the level of the radial styloid process, positive Finkelstein sign. I think there are 2 pain generators but the dominant pain generator is the first extensor compartment. Today we did a first extensor compartment injection, go back to wrist sleeve, adding de Quervain's conditioning, return to see me in about 1 month to 6 weeks and if persistent pain we will try a thumb basal joint versus triscaphe joint injection.    ____________________________________________ Ihor Austin. Benjamin Stain, M.D., ABFM., CAQSM., AME. Primary Care and Sports Medicine [facility]  Adjunct Professor of Family Medicine  [place] [employer] [facility] [employer]  [place]

## 2022-04-25 IMAGING — CT CT ABDOMEN WO/W CM
2 of 10 series · 10 of 46 positions shown, 16 images · IV contrast (omnipaque)
Comparison: 10/11/2020

CLINICAL DATA: Adrenal nodule

EXAM:
CT ABDOMEN AND PELVIS WITHOUT AND WITH CONTRAST
TECHNIQUE: Multidetector CT imaging of the abdomen and pelvis was performed
following the standard protocol before and following the bolus
administration of intravenous contrast.
CONTRAST:  82mL OMNIPAQUE IOHEXOL 350 MG/ML SOLN

[Series 2: axial st · axial · 0.79mm/px · z∈[-224,-14]mm · 8 of 90 slices shown, 13 images]
[im 10/90  soft-tissue]
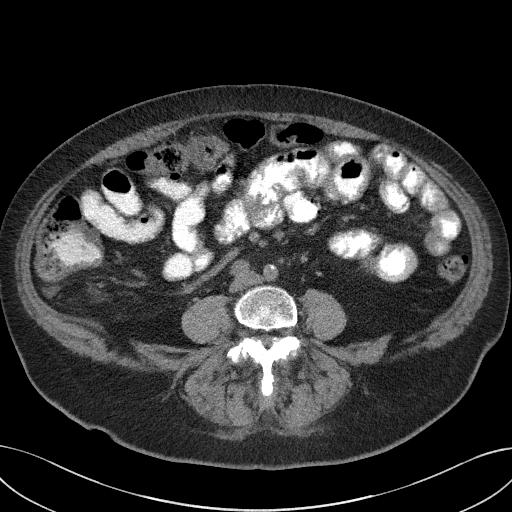
[im 10/90  bone]
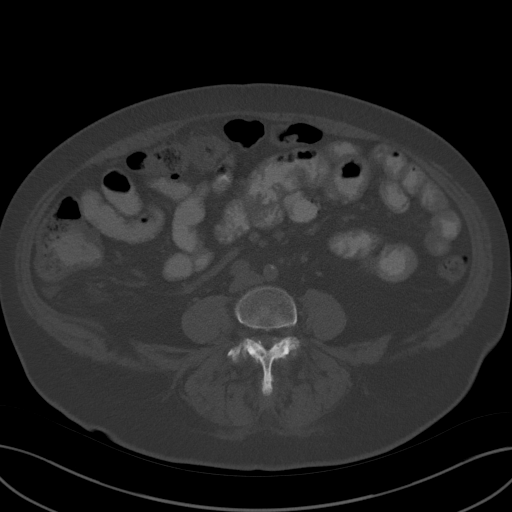
[im 20/90  soft-tissue]
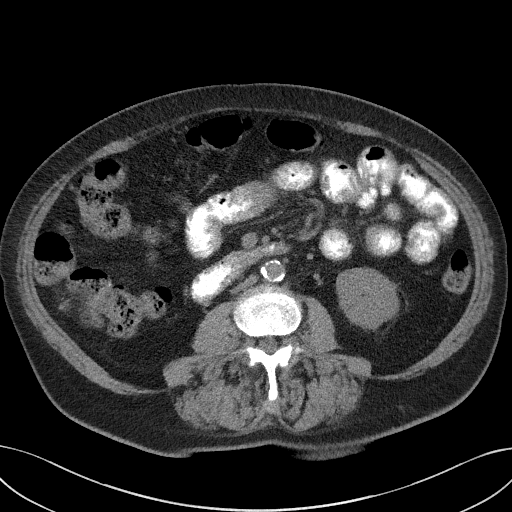
[im 30/90  soft-tissue]
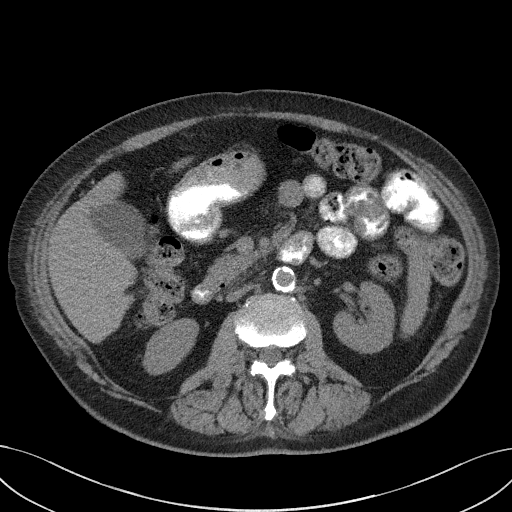
[im 40/90  soft-tissue]
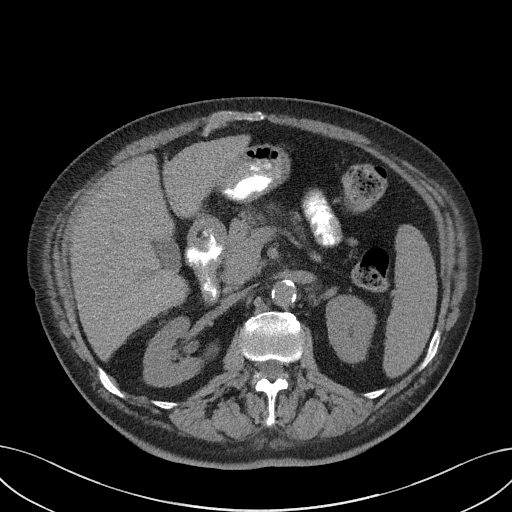
[im 50/90  soft-tissue]
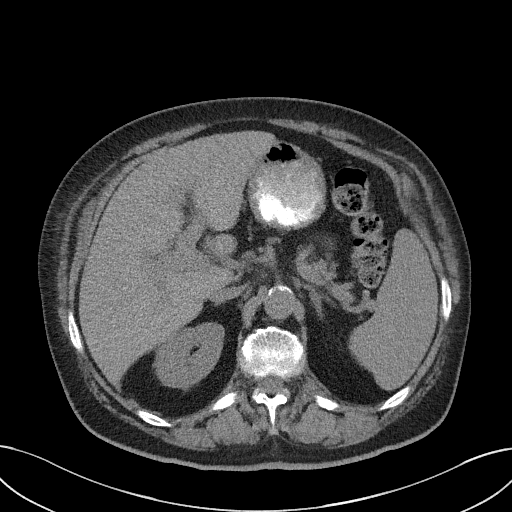
[im 50/90  lung]
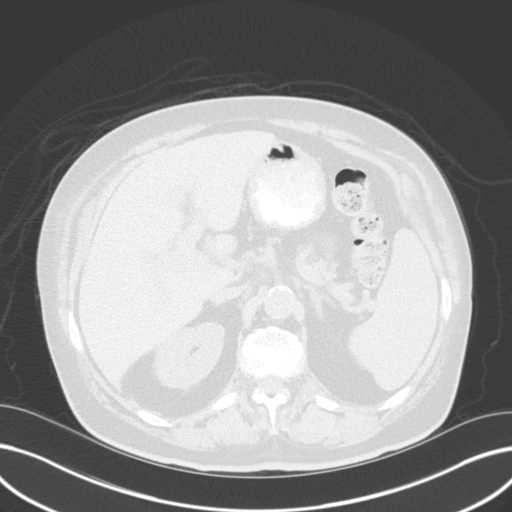
[im 60/90  soft-tissue]
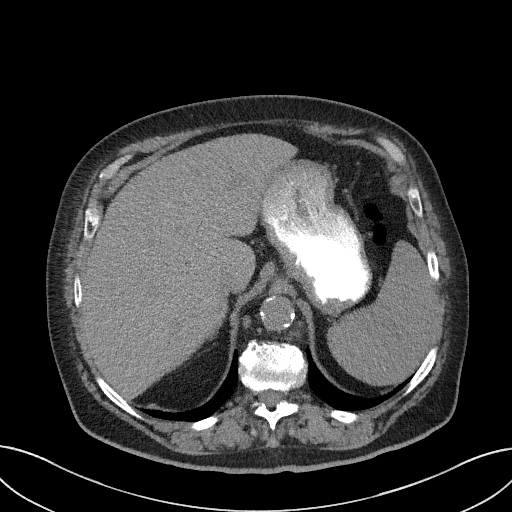
[im 60/90  lung]
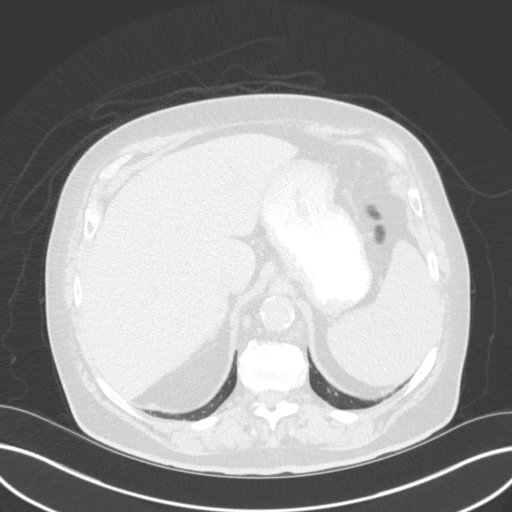
[im 70/90  soft-tissue]
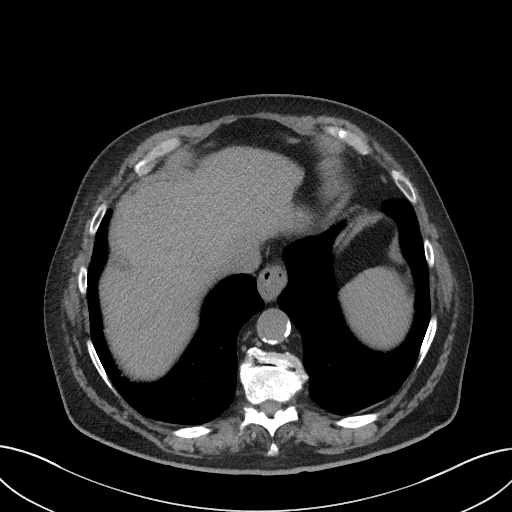
[im 70/90  lung]
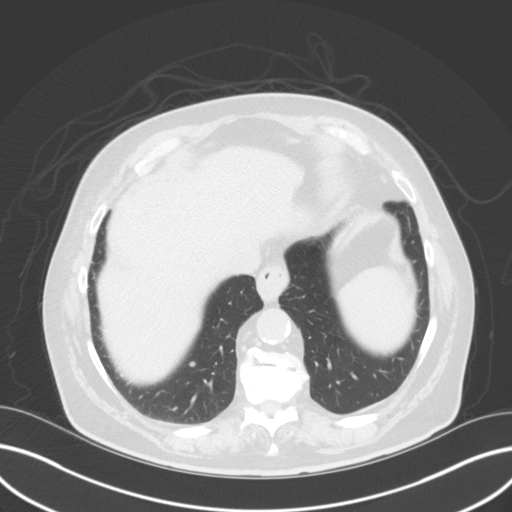
[im 80/90  soft-tissue]
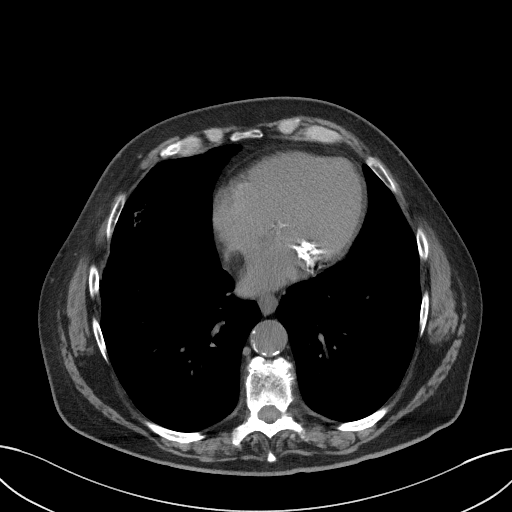
[im 80/90  lung]
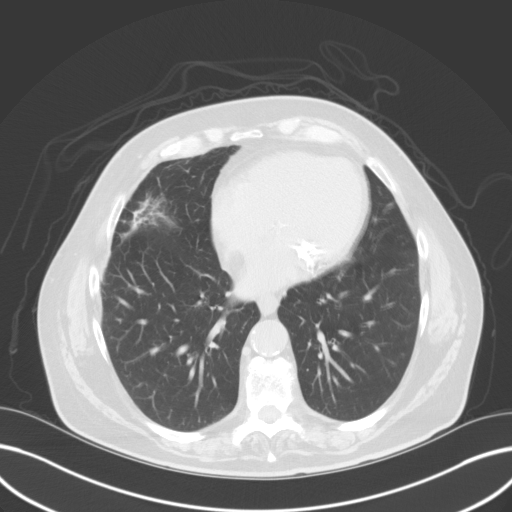

[Series 5: coronal wo · coronal · 0.52mm/px · 2 of 138 slices shown, 3 images]
[im 46/138  soft-tissue]
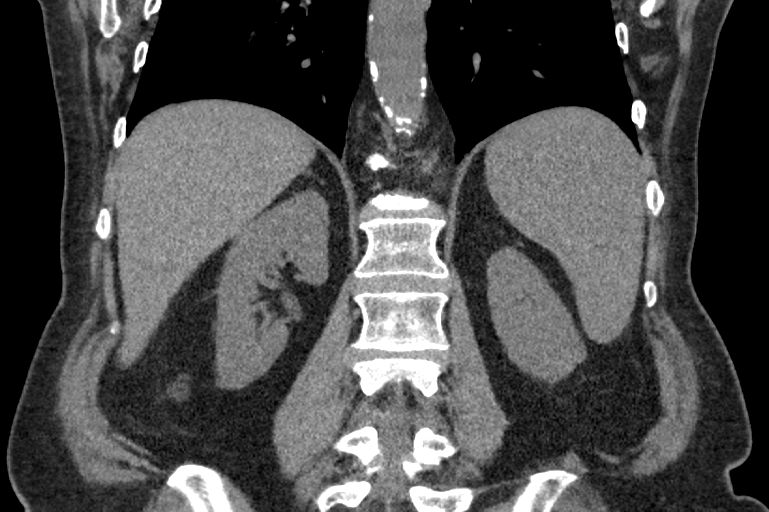
[im 46/138  bone]
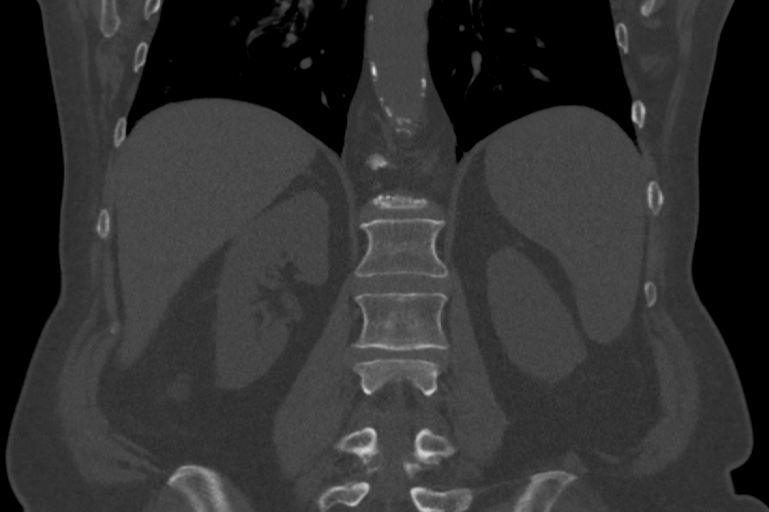
[im 92/138  soft-tissue]
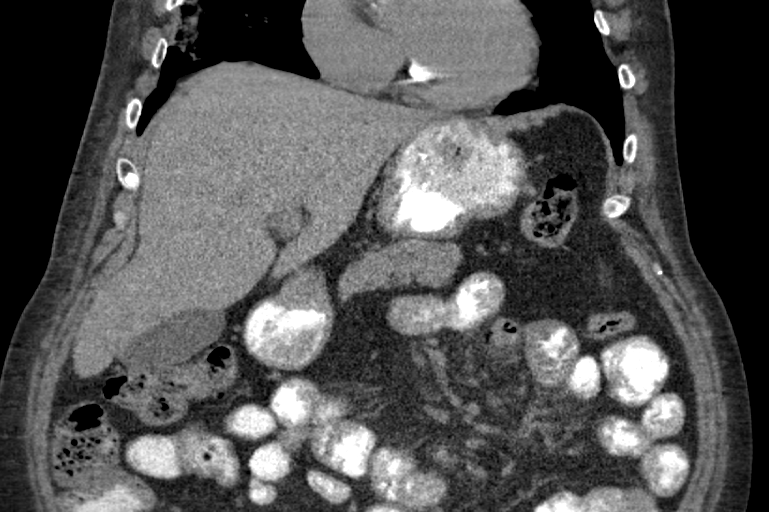

[10 of 46 positions shown; findings below may reference images not displayed]

FINDINGS: Lower chest: Atelectasis or scarring noted inferior right middle
lobe.

Hepatobiliary: No suspicious focal abnormality within the liver
parenchyma. There is no evidence for gallstones, gallbladder wall
thickening, or pericholecystic fluid. No intrahepatic or
extrahepatic biliary dilation.

Pancreas: No focal mass lesion. No dilatation of the main duct. No
intraparenchymal cyst. No peripancreatic edema.

Spleen: No splenomegaly. No focal mass lesion.

Adrenals/Urinary Tract: Right adrenal gland unremarkable. As noted
previously, there is a 12 mm adenoma in the posterior left adrenal
gland. 18 mm more anterior nodule in the left adrenal gland has
absolute attenuation too high to allow classification as adenoma.
Absolute washout is calculated at 70 1% with a relative washout of
52%, both values consistent with benign lipid poor adrenal adenoma.
Kidneys unremarkable.

Stomach/Bowel: Stomach is unremarkable. No gastric wall thickening.
No evidence of outlet obstruction. Duodenum is normally positioned
as is the ligament of Treitz. Insert normal abdominal bowel

Vascular/Lymphatic: There is moderate atherosclerotic calcification
of the abdominal aorta without aneurysm. Upper normal right
retrocrural node is associated with 11 mm short axis left
retrocrural node, borderline enlarged.

Other: No intraperitoneal free fluid.

Musculoskeletal: No worrisome lytic or sclerotic osseous
abnormality.
IMPRESSION: 1. Two tiny left adrenal nodules as seen on prior CT. The more
anterior of the 2, measuring 18 mm has washout characteristics
consistent with lipid poor adenoma. The more posterior 12 mm nodule
has absolute attenuation consistent with adenoma. No further imaging
follow-up warranted.

## 2022-05-02 ENCOUNTER — Other Ambulatory Visit: Payer: Self-pay | Admitting: Family Medicine

## 2022-05-03 ENCOUNTER — Ambulatory Visit: Payer: Medicare Other | Admitting: Sports Medicine

## 2022-05-08 ENCOUNTER — Ambulatory Visit (INDEPENDENT_AMBULATORY_CARE_PROVIDER_SITE_OTHER): Payer: Medicare Other | Admitting: Family Medicine

## 2022-05-08 ENCOUNTER — Other Ambulatory Visit: Payer: Self-pay | Admitting: Family Medicine

## 2022-05-08 ENCOUNTER — Encounter: Payer: Self-pay | Admitting: Family Medicine

## 2022-05-08 VITALS — BP 123/75 | HR 92 | Ht 65.5 in | Wt 228.0 lb

## 2022-05-08 DIAGNOSIS — E119 Type 2 diabetes mellitus without complications: Secondary | ICD-10-CM | POA: Diagnosis not present

## 2022-05-08 DIAGNOSIS — M654 Radial styloid tenosynovitis [de Quervain]: Secondary | ICD-10-CM

## 2022-05-08 DIAGNOSIS — I10 Essential (primary) hypertension: Secondary | ICD-10-CM | POA: Diagnosis not present

## 2022-05-08 DIAGNOSIS — I480 Paroxysmal atrial fibrillation: Secondary | ICD-10-CM

## 2022-05-08 LAB — POCT GLYCOSYLATED HEMOGLOBIN (HGB A1C): HbA1c, POC (controlled diabetic range): 8.4 % — AB (ref 0.0–7.0)

## 2022-05-08 MED ORDER — METFORMIN HCL 1000 MG PO TABS: 1000.0000 mg | ORAL_TABLET | Freq: Two times a day (BID) | ORAL | 0 refills | Status: DC

## 2022-05-08 MED ORDER — RYBELSUS 7 MG PO TABS
7.0000 mg | ORAL_TABLET | Freq: Every day | ORAL | 2 refills | Status: DC
Start: 2022-05-08 — End: 2022-08-29

## 2022-05-08 MED ORDER — RYBELSUS 3 MG PO TABS: 3.0000 mg | ORAL_TABLET | Freq: Every day | ORAL | 0 refills | Status: DC

## 2022-05-08 NOTE — Assessment & Plan Note (Signed)
BP remains well controlled.  Continue lisinopril at current strength.   ?

## 2022-05-08 NOTE — Assessment & Plan Note (Signed)
Continue metformin.  Adding rybelsus 3mg  x30 days then increase to 7mg .  F/u in 3 months.

## 2022-05-08 NOTE — Assessment & Plan Note (Signed)
Resolved after injection 

## 2022-05-08 NOTE — Assessment & Plan Note (Signed)
Followed by cardiology.  Singular episode prior to having port placed.  Has remained in NSR.

## 2022-05-08 NOTE — Patient Instructions (Signed)
Continue metformin.  Start rybelsus: Take 3mg  daily x30 days. After 30 days increase to 7mg .

## 2022-05-08 NOTE — Progress Notes (Signed)
Jasmine Montgomery - 72 y.o. female MRN [id number]  Date of birth: 1950/11/25  Subjective Chief Complaint  Patient presents with   Diabetes    HPI Jasmine Montgomery is a 72 y.o. female here today for follow up visit.   She reports that she is doing well.  She had injection for DeQuervain's with Dr. Benjamin Stain.  Symptoms are essentially resolved at this point.   She was recently seen by cardiology for f/u of her A. Fib.  She remains in NSR.  She is on lisinopril for HTN but no BB at this time.   Continues on metformin for management of diabetes.  Tolerating well.  A1c today is increased.  Diet has been pretty consistent.  Activity levels is improving some.  Previously on glipizide.   ROS:  A comprehensive ROS was completed and negative except as noted per HPI  No Known Allergies  Past Medical History:  Diagnosis Date   A-fib (HCC)    Cancer (HCC) 08/2019   Diabetes mellitus without complication (HCC)    History of breast cancer    Hyperlipidemia    Hypertension    Stroke [facility])     Past Surgical History:  Procedure Laterality Date   BREAST SURGERY  03/2020   HERNIA REPAIR  2010   SMALL INTESTINE SURGERY  03/2016    Social History   Socioeconomic History   Marital status: Widowed    Spouse name: Not on file   Number of children: 3   Years of education: 14   Highest education level: Associate degree: academic program  Occupational History   Occupation: Retired    Comment: retired Engineer, civil (consulting)  Tobacco Use   Smoking status: Every Day    Packs/day: 0.50    Years: 15.00    Additional pack years: 0.00    Total pack years: 7.50    Types: Cigarettes   Smokeless tobacco: Never  Vaping Use   Vaping Use: Never used  Substance and Sexual Activity   Alcohol use: Not Currently   Drug use: Not Currently    Types: Marijuana   Sexual activity: Not Currently    Partners: Female, Female    Birth control/protection: Post-menopausal  Other Topics Concern   Not on file  Social  History Narrative   Lives with her daughter and her family. She enjoys quilting and painting pillow cases.   Social Determinants of Health   Financial Resource Strain: Low Risk  (07/27/2021)   Overall Financial Resource Strain (CARDIA)    Difficulty of Paying Living Expenses: Not hard at all  Food Insecurity: No Food Insecurity (07/27/2021)   Hunger Vital Sign    Worried About Running Out of Food in the Last Year: Never true    Ran Out of Food in the Last Year: Never true  Transportation Needs: No Transportation Needs (07/27/2021)   PRAPARE - Administrator, Civil Service (Medical): No    Lack of Transportation (Non-Medical): No  Physical Activity: Insufficiently Active (07/27/2021)   Exercise Vital Sign    Days of Exercise per Week: 2 days    Minutes of Exercise per Session: 30 min  Stress: No Stress Concern Present (07/27/2021)   [employer] of Occupational Health - Occupational Stress Questionnaire    Feeling of Stress : Not at all  Social Connections: Socially Isolated (08/02/2021)   Social Connection and Isolation Panel [NHANES]    Frequency of Communication with Friends and Family: More than three times a week    Frequency of  Social Gatherings with Friends and Family: Once a week    Attends Religious Services: Never    Database administrator or Organizations: No    Attends Banker Meetings: Never    Marital Status: Widowed    Family History  Problem Relation Age of Onset   Breast cancer Sister    Cancer Sister    Diabetes Brother    Hypertension Mother    Miscarriages / Stillbirths Mother    Varicose Veins Mother    Cancer Paternal Grandmother    Obesity Paternal Grandmother     Health Maintenance  Topic Date Due   Zoster Vaccines- Shingrix (1 of 2) 05/16/2022 (Originally [birth year]06/1971)   COLONOSCOPY (Pts 45-31yrs Insurance coverage will need to be confirmed)  08/03/2022 (Originally 04/05/1997)   Hepatitis C Screening  08/03/2022 (Originally  04/06/1970)   MAMMOGRAM  11/08/2022 (Originally 09/18/2021)   COVID-19 Vaccine (5 - 2023-24 season) 02/24/2023 (Originally 09/09/2021)   Medicare Annual Wellness (AWV)  08/03/2022   Diabetic kidney evaluation - eGFR measurement  08/04/2022   Diabetic kidney evaluation - Urine ACR  08/04/2022   INFLUENZA VACCINE  08/10/2022   FOOT EXAM  09/07/2022   OPHTHALMOLOGY EXAM  10/22/2022   HEMOGLOBIN A1C  11/07/2022   DTaP/Tdap/Td (3 - Td or Tdap) 09/30/2025   DEXA SCAN  01/05/2030   Pneumonia Vaccine 81+ Years old  Completed   HPV VACCINES  Aged Out     ----------------------------------------------------------------------------------------------------------------------------------------------------------------------------------------------------------------- Physical Exam BP 123/75 (BP Location: Left Arm, Patient Position: Sitting, Cuff Size: Large)   Pulse 92   Ht 5' 5.5" (1.664 m)   Wt 228 lb (103.4 kg)   SpO2 96%   BMI 37.36 kg/m   Physical Exam Constitutional:      Appearance: Normal appearance.  HENT:     Head: Normocephalic and atraumatic.  Eyes:     General: No scleral icterus. Cardiovascular:     Rate and Rhythm: Normal rate and regular rhythm.  Pulmonary:     Effort: Pulmonary effort is normal.     Breath sounds: Normal breath sounds.  Neurological:     General: No focal deficit present.     Mental Status: She is alert.     ------------------------------------------------------------------------------------------------------------------------------------------------------------------------------------------------------------------- Assessment and Plan  Essential hypertension BP remains well controlled.  Continue lisinopril at current strength.    Type 2 diabetes mellitus without complication, without long-term current use of insulin (HCC) Continue metformin.  Adding rybelsus 3mg  x30 days then increase to 7mg .  F/u in 3 months.   Paroxysmal atrial fibrillation  (HCC) Followed by cardiology.  Singular episode prior to having port placed.  Has remained in NSR.   De Quervain's tenosynovitis, left Resolved after injection.    Meds ordered this encounter  Medications   metFORMIN (GLUCOPHAGE) 1000 MG tablet    Sig: Take 1 tablet (1,000 mg total) by mouth 2 (two) times daily with a meal.    Dispense:  180 tablet    Refill:  0   Semaglutide (RYBELSUS) 3 MG TABS    Sig: Take 1 tablet (3 mg total) by mouth daily. Lot: [id number] Exp: 04/2023    Dispense:  30 tablet    Refill:  0    <SAMPLE>   Semaglutide (RYBELSUS) 7 MG TABS    Sig: Take 1 tablet (7 mg total) by mouth daily.    Dispense:  30 tablet    Refill:  2    No follow-ups on file.    This visit occurred during the  SARS-CoV-2 public health emergency.  Safety protocols were in place, including screening questions prior to the visit, additional usage of staff PPE, and extensive cleaning of exam room while observing appropriate contact time as indicated for disinfecting solutions.

## 2022-06-02 ENCOUNTER — Ambulatory Visit (INDEPENDENT_AMBULATORY_CARE_PROVIDER_SITE_OTHER): Payer: Medicare Other | Admitting: Sports Medicine

## 2022-06-02 DIAGNOSIS — M654 Radial styloid tenosynovitis [de Quervain]: Secondary | ICD-10-CM | POA: Diagnosis not present

## 2022-06-02 NOTE — Assessment & Plan Note (Signed)
Doing extremely well after injection, return as needed, continue home conditioning

## 2022-06-02 NOTE — Progress Notes (Signed)
    Procedures performed today:    None.  Independent interpretation of notes and tests performed by another provider:   None.  Brief History, Exam, Impression, and Recommendations:    De Quervain's tenosynovitis, left Doing extremely well after injection, return as needed, continue home conditioning    ____________________________________________ Ihor Austin. Benjamin Stain, M.D., ABFM., CAQSM., AME. Primary Care and Sports Medicine [facility]  Adjunct Professor of Family Medicine  [place] [employer]  Restaurant manager, fast food

## 2022-06-18 ENCOUNTER — Other Ambulatory Visit: Payer: Self-pay | Admitting: Family Medicine

## 2022-06-24 ENCOUNTER — Other Ambulatory Visit: Payer: Self-pay | Admitting: Family Medicine

## 2022-07-03 ENCOUNTER — Other Ambulatory Visit: Payer: Self-pay | Admitting: Family Medicine

## 2022-07-03 DIAGNOSIS — R55 Syncope and collapse: Secondary | ICD-10-CM

## 2022-07-03 DIAGNOSIS — I4891 Unspecified atrial fibrillation: Secondary | ICD-10-CM

## 2022-07-31 ENCOUNTER — Other Ambulatory Visit: Payer: Self-pay | Admitting: Family Medicine

## 2022-08-03 ENCOUNTER — Other Ambulatory Visit: Payer: Self-pay | Admitting: Family Medicine

## 2022-08-07 ENCOUNTER — Ambulatory Visit: Payer: Medicare Other | Admitting: Family Medicine

## 2022-08-07 VITALS — BP 134/60 | HR 75 | Ht 65.5 in | Wt 228.0 lb

## 2022-08-07 DIAGNOSIS — Z0000 Encounter for general adult medical examination without abnormal findings: Secondary | ICD-10-CM | POA: Diagnosis not present

## 2022-08-07 NOTE — Progress Notes (Signed)
MEDICARE ANNUAL WELLNESS VISIT  08/07/2022  Subjective:  Jasmine Montgomery is a 72 y.o. female patient of Everrett Coombe, DO who had a Medicare Annual Wellness Visit today. Jasmine Montgomery is Retired and lives with their family. Jasmine Montgomery has 3 children. Jasmine Montgomery reports that Jasmine Montgomery is socially active and does interact with friends/family regularly. Jasmine Montgomery is moderately physically active and enjoys quilting and painting pillow cases.  Patient Care Team: Everrett Coombe, DO as PCP - General (Family Medicine)     08/07/2022    9:16 AM 08/02/2021    9:11 AM 04/02/2020    2:09 PM  Advanced Directives  Does Patient Have a Medical Advance Directive? No No No  Would patient like information on creating a medical advance directive? No - Patient declined Yes (ED - Information included in AVS) No - Patient declined    Hospital Utilization Over the Past 12 Months: # of hospitalizations or ER visits: 1 # of surgeries: 0  Review of Systems    Patient reports that her overall health is unchanged when compared to last year.  Review of Systems: History obtained from chart review and the patient  All other systems negative.  Pain Assessment Pain : No/denies pain     Current Medications & Allergies (verified) Allergies as of 08/07/2022   No Known Allergies      Medication List        Accurate as of August 07, 2022  9:43 AM. If you have any questions, ask your nurse or doctor.          anastrozole 1 MG tablet Commonly known as: ARIMIDEX Take 1 mg by mouth daily.   Cholecalciferol 25 MCG (1000 UT) tablet Take by mouth.   clotrimazole-betamethasone cream Commonly known as: LOTRISONE APPLY  CREAM TOPICALLY TWICE DAILY   cyanocobalamin 500 MCG tablet Commonly known as: VITAMIN B12 Take by mouth.   lisinopril 10 MG tablet Commonly known as: ZESTRIL Take 1 tablet by mouth once daily   metFORMIN 1000 MG tablet Commonly known as: GLUCOPHAGE TAKE 1 TABLET BY MOUTH TWICE DAILY WITH A MEAL    ondansetron 4 MG tablet Commonly known as: ZOFRAN Take 4 mg by mouth every 8 (eight) hours as needed.   Rybelsus 3 MG Tabs Generic drug: Semaglutide Take 1 tablet (3 mg total) by mouth daily. Lot: [id number] Exp: 04/2023   Rybelsus 7 MG Tabs Generic drug: Semaglutide Take 1 tablet (7 mg total) by mouth daily.   simvastatin 20 MG tablet Commonly known as: ZOCOR Take 1 tablet (20 mg total) by mouth daily at 6 PM.   Ventolin HFA 108 (90 Base) MCG/ACT inhaler Generic drug: albuterol INHALE 1 TO 2 PUFFS BY MOUTH EVERY 4 HOURS AS NEEDED FOR WHEEZING AND FOR SHORTNESS OF BREATH   VITAMIN A PO Take by mouth.   VITAMIN C PO Take by mouth.   VITAMIN E PO Take by mouth.        History (reviewed): Past Medical History:  Diagnosis Date   A-fib (HCC)    Cancer (HCC) 08/2019   Diabetes mellitus without complication (HCC)    History of breast cancer    Hyperlipidemia    Hypertension    Stroke [facility])    Past Surgical History:  Procedure Laterality Date   BREAST SURGERY  03/2020   HERNIA REPAIR  2010   SMALL INTESTINE SURGERY  03/2016   Family History  Problem Relation Age of Onset   Breast cancer Sister    Cancer Sister    Diabetes  Brother    Hypertension Mother    Miscarriages / [place] Mother    Varicose Veins Mother    Cancer Paternal Grandmother    Obesity Paternal Grandmother    Social History   Socioeconomic History   Marital status: Widowed    Spouse name: Not on file   Number of children: 3   Years of education: 14   Highest education level: Associate degree: academic program  Occupational History   Occupation: Retired    Comment: retired Engineer, civil (consulting)  Tobacco Use   Smoking status: Every Day    Current packs/day: 0.50    Average packs/day: 0.5 packs/day for 15.0 years (7.5 ttl pk-yrs)    Types: Cigarettes   Smokeless tobacco: Never  Vaping Use   Vaping status: Never Used  Substance and Sexual Activity   Alcohol use: Not Currently   Drug use: Not  Currently    Types: Marijuana   Sexual activity: Not Currently    Partners: Female, Female    Birth control/protection: Post-menopausal  Other Topics Concern   Not on file  Social History Narrative   Lives with her daughter and her family. Jasmine Montgomery enjoys quilting and painting pillow cases.   Social Determinants of Health   Financial Resource Strain: Low Risk  (08/07/2022)   Overall Financial Resource Strain (CARDIA)    Difficulty of Paying Living Expenses: Not hard at all  Food Insecurity: No Food Insecurity (08/07/2022)   Hunger Vital Sign    Worried About Running Out of Food in the Last Year: Never true    Ran Out of Food in the Last Year: Never true  Transportation Needs: No Transportation Needs (08/07/2022)   PRAPARE - Administrator, Civil Service (Medical): No    Lack of Transportation (Non-Medical): No  Physical Activity: Inactive (08/07/2022)   Exercise Vital Sign    Days of Exercise per Week: 0 days    Minutes of Exercise per Session: 0 min  Stress: No Stress Concern Present (08/07/2022)   [employer] of Occupational Health - Occupational Stress Questionnaire    Feeling of Stress : Not at all  Social Connections: Moderately Isolated (08/07/2022)   Social Connection and Isolation Panel [NHANES]    Frequency of Communication with Friends and Family: Never    Frequency of Social Gatherings with Friends and Family: More than three times a week    Attends Religious Services: More than 4 times per year    Active Member of [employer] or Organizations: No    Attends Banker Meetings: Never    Marital Status: Widowed    Activities of Daily Living    08/07/2022    9:23 AM  In your present state of health, do you have any difficulty performing the following activities:  Hearing? 0  Vision? 0  Difficulty concentrating or making decisions? 0  Walking or climbing stairs? 0  Dressing or bathing? 0  Doing errands, shopping? 0  Preparing Food and eating ? N   Using the Toilet? N  In the past six months, have you accidently leaked urine? Y  Do you have problems with loss of bowel control? N  Managing your Medications? N  Managing your Finances? N  Housekeeping or managing your Housekeeping? N    Patient Education/Literacy How often do you need to have someone help you when you read instructions, pamphlets, or other written materials from your doctor or pharmacy?: 1 - Never What is the last grade level you completed in school?: 2  years of college  Exercise    Diet Patient reports consuming 2 meals a day and 0 snack(s) a day Patient reports that her primary diet is: Regular Patient reports that Jasmine Montgomery does have regular access to food.   Depression Screen    08/07/2022    9:16 AM 05/08/2022   11:13 AM 02/07/2022   10:57 AM 11/07/2021   10:17 AM 08/02/2021    9:21 AM 04/02/2020    2:09 PM  PHQ 2/9 Scores  PHQ - 2 Score 0 0 0 0 0 1  PHQ- 9 Score      1     Fall Risk    08/07/2022    9:09 AM 05/08/2022   11:12 AM 02/07/2022   10:57 AM 11/07/2021   10:16 AM 08/02/2021    9:20 AM  Fall Risk   Falls in the past year? 1 0 0 0 0  Number falls in past yr: 0 0 0 0 0  Injury with Fall? 0 0 0 0 0  Risk for fall due to : History of fall(s) No Fall Risks No Fall Risks No Fall Risks No Fall Risks  Follow up Falls evaluation completed;Education provided;Falls prevention discussed Falls evaluation completed Falls evaluation completed Falls evaluation completed Falls evaluation completed     Objective:   BP 134/60   Pulse 75   Ht 5' 5.5" (1.664 m)   Wt 228 lb (103.4 kg)   SpO2 97%   BMI 37.36 kg/m   Last Weight  Most recent update: 08/07/2022  9:10 AM    Weight  103.4 kg (228 lb)             Body mass index is 37.36 kg/m.  Hearing/Vision  Climmie did not have difficulty with hearing/understanding during the face-to-face interview Madelline did not have difficulty with her vision during the face-to-face interview Reports that Jasmine Montgomery  has had a formal eye exam by an eye care professional within the past year Reports that Jasmine Montgomery has not had a formal hearing evaluation within the past year  Cognitive Function:    08/07/2022    9:30 AM 08/02/2021    9:28 AM  6CIT Screen  What Year? 0 points 0 points  What month? 0 points 0 points  What time? 0 points 0 points  Count back from 20 0 points 0 points  Months in reverse 0 points 0 points  Repeat phrase 0 points 2 points  Total Score 0 points 2 points    Normal Cognitive Function Screening: Yes (Normal:0-7, Significant for Dysfunction: >8)  Immunization & Health Maintenance Record Immunization History  Administered Date(s) Administered   PFIZER Comirnaty(Gray Top)Covid-19 Tri-Sucrose Vaccine 09/18/2019, 10/08/2019   PFIZER(Purple Top)SARS-COV-2 Vaccination 09/18/2019, 10/29/2019   PNEUMOCOCCAL CONJUGATE-20 08/29/2021   Pneumococcal-Unspecified 08/29/2021   Td 10/01/2015   Tdap 10/01/2015    Health Maintenance  Topic Date Due   Medicare Annual Wellness (AWV)  08/03/2022   Diabetic kidney evaluation - Urine ACR  08/08/2022 (Originally 08/04/2022)   Diabetic kidney evaluation - eGFR measurement  08/10/2022 (Originally 08/04/2022)   Colonoscopy  08/10/2022 (Originally 04/05/1997)   Zoster Vaccines- Shingrix (1 of 2) 11/07/2022 (Originally 04/06/1971)   MAMMOGRAM  11/08/2022 (Originally 09/18/2021)   COVID-19 Vaccine (5 - 2023-24 season) 02/24/2023 (Originally 09/09/2021)   Hepatitis C Screening  08/07/2023 (Originally 04/06/1970)   INFLUENZA VACCINE  08/10/2022   FOOT EXAM  09/07/2022   OPHTHALMOLOGY EXAM  10/22/2022   HEMOGLOBIN A1C  11/07/2022   DTaP/Tdap/Td (3 - Td or  Tdap) 09/30/2025   DEXA SCAN  01/18/2032   Pneumonia Vaccine 68+ Years old  Completed   HPV VACCINES  Aged Out       Assessment  This is a routine wellness examination for [place].  Health Maintenance: Due or Overdue Health Maintenance Due  Topic Date Due   Medicare Annual Wellness (AWV)   08/03/2022    Dala Dock does not need a referral for Community Assistance: Care Management:   no Social Work:    no Prescription Assistance:  no Nutrition/Diabetes Education:  no   Plan:  Personalized Goals  Goals Addressed               This Visit's Progress     Patient Stated (pt-stated)        Patient stated that Jasmine Montgomery would like to work on her strength.       Personalized Health Maintenance & Screening Recommendations  Shingles vaccine Colorectal cancer screening - declined Urine ACR - scheduled for appt with PCP on 08/11/22 Diabetic kidney evaluation - scheduled for appt with PCP on 08/11/22  Lung Cancer Screening Recommended: yes, up to date (Low Dose CT Chest recommended if Age 74-80 years, 20 pack-year currently smoking OR have quit w/in past 15 years) Hepatitis C Screening recommended: no HIV Screening recommended: no  Advanced Directives: Written information was not given per the patient's request.  Referrals & Orders No orders of the defined types were placed in this encounter.   Follow-up Plan Follow-up with Everrett Coombe, DO as planned Schedule shingles vaccine at the pharmacy.  Medicare wellness visit in one year.  AVS printed and given to the patient.   I have personally reviewed and noted the following in the patient's chart:   Medical and social history Use of alcohol, tobacco or illicit drugs  Current medications and supplements Functional ability and status Nutritional status Physical activity Advanced directives List of other physicians Hospitalizations, surgeries, and ER visits in previous 12 months Vitals Screenings to include cognitive, depression, and falls Referrals and appointments  In addition, I have reviewed and discussed with patient certain preventive protocols, quality metrics, and best practice recommendations. A written personalized care plan for preventive services as well as general preventive health recommendations  were provided to patient.     Modesto Charon, RN BSN  08/07/2022

## 2022-08-07 NOTE — Patient Instructions (Addendum)
MEDICARE ANNUAL WELLNESS VISIT Health Maintenance Summary and Written Plan of Care  Jasmine Montgomery ,  Thank you for allowing me to perform your Medicare Annual Wellness Visit and for your ongoing commitment to your health.   Health Maintenance & Immunization History Health Maintenance  Topic Date Due   Diabetic kidney evaluation - Urine ACR  08/08/2022 (Originally 08/04/2022)   Diabetic kidney evaluation - eGFR measurement  08/10/2022 (Originally 08/04/2022)   Colonoscopy  08/10/2022 (Originally 04/05/1997)   Zoster Vaccines- Shingrix (1 of 2) 11/07/2022 (Originally 04/06/1971)   MAMMOGRAM  11/08/2022 (Originally 09/18/2021)   COVID-19 Vaccine (5 - 2023-24 season) 02/24/2023 (Originally 09/09/2021)   Hepatitis C Screening  08/07/2023 (Originally 04/06/1970)   INFLUENZA VACCINE  08/10/2022   FOOT EXAM  09/07/2022   OPHTHALMOLOGY EXAM  10/22/2022   HEMOGLOBIN A1C  11/07/2022   Medicare Annual Wellness (AWV)  08/07/2023   DTaP/Tdap/Td (3 - Td or Tdap) 09/30/2025   DEXA SCAN  01/18/2032   Pneumonia Vaccine 4+ Years old  Completed   HPV VACCINES  Aged Out   Immunization History  Administered Date(s) Administered   PFIZER Comirnaty(Gray Top)Covid-19 Tri-Sucrose Vaccine 09/18/2019, 10/08/2019   PFIZER(Purple Top)SARS-COV-2 Vaccination 09/18/2019, 10/29/2019   PNEUMOCOCCAL CONJUGATE-20 08/29/2021   Pneumococcal-Unspecified 08/29/2021   Td 10/01/2015   Tdap 10/01/2015    These are the patient goals that we discussed:  Goals Addressed               This Visit's Progress     Patient Stated (pt-stated)        Patient stated that she would like to work on her strength.         This is a list of Health Maintenance Items that are overdue or due now: Shingles vaccine Colorectal cancer screening - declined Urine ACR - scheduled for appt with PCP on 08/11/22 Diabetic kidney evaluation - scheduled for appt with PCP on 08/11/22  Orders/Referrals Placed Today: No orders of the defined  types were placed in this encounter.  (Contact our referral department at [phone] if you have not spoken with someone about your referral appointment within the next 5 days)    Follow-up Plan Follow-up with Everrett Coombe, DO as planned Schedule shingles vaccine at the pharmacy.  Medicare wellness visit in one year.  AVS printed and given to the patient.      Health Maintenance, Female Adopting a healthy lifestyle and getting preventive care are important in promoting health and wellness. Ask your health care provider about: The right schedule for you to have regular tests and exams. Things you can do on your own to prevent diseases and keep yourself healthy. What should I know about diet, weight, and exercise? Eat a healthy diet  Eat a diet that includes plenty of vegetables, fruits, low-fat dairy products, and lean protein. Do not eat a lot of foods that are high in solid fats, added sugars, or sodium. Maintain a healthy weight Body mass index (BMI) is used to identify weight problems. It estimates body fat based on height and weight. Your health care provider can help determine your BMI and help you achieve or maintain a healthy weight. Get regular exercise Get regular exercise. This is one of the most important things you can do for your health. Most adults should: Exercise for at least 150 minutes each week. The exercise should increase your heart rate and make you sweat (moderate-intensity exercise). Do strengthening exercises at least twice a week. This is in addition to the  moderate-intensity exercise. Spend less time sitting. Even light physical activity can be beneficial. Watch cholesterol and blood lipids Have your blood tested for lipids and cholesterol at 70 years of age, then have this test every 5 years. Have your cholesterol levels checked more often if: Your lipid or cholesterol levels are high. You are older than 70 years of age. You are at high risk for  heart disease. What should I know about cancer screening? Depending on your health history and family history, you may need to have cancer screening at various ages. This may include screening for: Breast cancer. Cervical cancer. Colorectal cancer. Skin cancer. Lung cancer. What should I know about heart disease, diabetes, and high blood pressure? Blood pressure and heart disease High blood pressure causes heart disease and increases the risk of stroke. This is more likely to develop in people who have high blood pressure readings or are overweight. Have your blood pressure checked: Every 3-5 years if you are 58-45 years of age. Every year if you are 45 years old or older. Diabetes Have regular diabetes screenings. This checks your fasting blood sugar level. Have the screening done: Once every three years after age 52 if you are at a normal weight and have a low risk for diabetes. More often and at a younger age if you are overweight or have a high risk for diabetes. What should I know about preventing infection? Hepatitis B If you have a higher risk for hepatitis B, you should be screened for this virus. Talk with your health care provider to find out if you are at risk for hepatitis B infection. Hepatitis C Testing is recommended for: Everyone born from [birth year] through 1963. Anyone with known risk factors for hepatitis C. Sexually transmitted infections (STIs) Get screened for STIs, including gonorrhea and chlamydia, if: You are sexually active and are younger than 70 years of age. You are older than 70 years of age and your health care provider tells you that you are at risk for this type of infection. Your sexual activity has changed since you were last screened, and you are at increased risk for chlamydia or gonorrhea. Ask your health care provider if you are at risk. Ask your health care provider about whether you are at high risk for HIV. Your health care provider may recommend a  prescription medicine to help prevent HIV infection. If you choose to take medicine to prevent HIV, you should first get tested for HIV. You should then be tested every 3 months for as long as you are taking the medicine. Pregnancy If you are about to stop having your period (premenopausal) and you may become pregnant, seek counseling before you get pregnant. Take 400 to 800 micrograms (mcg) of folic acid every day if you become pregnant. Ask for birth control (contraception) if you want to prevent pregnancy. Osteoporosis and menopause Osteoporosis is a disease in which the bones lose minerals and strength with aging. This can result in bone fractures. If you are 60 years old or older, or if you are at risk for osteoporosis and fractures, ask your health care provider if you should: Be screened for bone loss. Take a calcium or vitamin D supplement to lower your risk of fractures. Be given hormone replacement therapy (HRT) to treat symptoms of menopause. Follow these instructions at home: Alcohol use Do not drink alcohol if: Your health care provider tells you not to drink. You are pregnant, may be pregnant, or are planning to become pregnant.  If you drink alcohol: Limit how much you have to: 0-1 drink a day. Know how much alcohol is in your drink. In the [place] one drink equals one 12 oz bottle of beer (355 mL), one 5 oz glass of wine (148 mL), or one 1 oz glass of hard liquor (44 mL). Lifestyle Do not use any products that contain nicotine or tobacco. These products include cigarettes, chewing tobacco, and vaping devices, such as e-cigarettes. If you need help quitting, ask your health care provider. Do not use street drugs. Do not share needles. Ask your health care provider for help if you need support or information about quitting drugs. General instructions Schedule regular health, dental, and eye exams. Stay current with your vaccines. Tell your health care provider if: You often  feel depressed. You have ever been abused or do not feel safe at home. Summary Adopting a healthy lifestyle and getting preventive care are important in promoting health and wellness. Follow your health care provider's instructions about healthy diet, exercising, and getting tested or screened for diseases. Follow your health care provider's instructions on monitoring your cholesterol and blood pressure. This information is not intended to replace advice given to you by your health care provider. Make sure you discuss any questions you have with your health care provider. Document Revised: 05/17/2020 Document Reviewed: 05/17/2020 Elsevier Patient Education  2024 [employer].

## 2022-08-11 ENCOUNTER — Encounter: Payer: Self-pay | Admitting: Family Medicine

## 2022-08-11 ENCOUNTER — Ambulatory Visit: Payer: Medicare Other | Admitting: Family Medicine

## 2022-08-11 VITALS — BP 119/82 | HR 131 | Ht 65.5 in | Wt 228.0 lb

## 2022-08-11 DIAGNOSIS — E785 Hyperlipidemia, unspecified: Secondary | ICD-10-CM

## 2022-08-11 DIAGNOSIS — I480 Paroxysmal atrial fibrillation: Secondary | ICD-10-CM

## 2022-08-11 DIAGNOSIS — E1169 Type 2 diabetes mellitus with other specified complication: Secondary | ICD-10-CM

## 2022-08-11 DIAGNOSIS — E119 Type 2 diabetes mellitus without complications: Secondary | ICD-10-CM | POA: Diagnosis not present

## 2022-08-11 DIAGNOSIS — Z7984 Long term (current) use of oral hypoglycemic drugs: Secondary | ICD-10-CM

## 2022-08-11 LAB — POCT GLYCOSYLATED HEMOGLOBIN (HGB A1C): HbA1c, POC (controlled diabetic range): 7 % (ref 0.0–7.0)

## 2022-08-11 MED ORDER — METOPROLOL SUCCINATE ER 25 MG PO TB24: 25.0000 mg | ORAL_TABLET | Freq: Every day | ORAL | 1 refills | Status: DC

## 2022-08-11 MED ORDER — APIXABAN 5 MG PO TABS: 5.0000 mg | ORAL_TABLET | Freq: Two times a day (BID) | ORAL | 3 refills | Status: DC

## 2022-08-11 NOTE — Assessment & Plan Note (Signed)
Noted to be in A. Fib w/ RVR today.  She is asymptomatic at this time.  Adding toprol xl 25mg  daily.  Checking electrolytes and TSH.  Her CHA2DS2-VASc Score is 6.  She does have remote history of ICH.  Did not require any intervention at that time when treated at [facility].  I think her increased risk of ischemic stroke warrants addition of anticoagulation.  I did go ahead and order an updated Echo.  She will follow up with her cardiologist at [facility] as well.

## 2022-08-11 NOTE — Assessment & Plan Note (Signed)
Blood sugars are better controlled.  Continue metformin with Rybelsus.  Encouraged continued dietary changes.

## 2022-08-11 NOTE — Patient Instructions (Addendum)
Start eliquis 5mg  twice per day Start toprol xl 25mg  daily.   I have ordered an Echocardiogram.  Schedule a visit with your cardiologist.  See me in 4 weeks.   Atrial Fibrillation Atrial fibrillation (AFib) is a type of heartbeat that is irregular or fast. If you have AFib, your heart beats without any order. This makes it hard for your heart to pump blood in a normal way. AFib may come and go, or it may become a long-lasting problem. If AFib is not treated, it can put you at higher risk for stroke, heart failure, and other heart problems. What are the causes? AFib may be caused by diseases that damage the heart's electrical system. They include: High blood pressure. Heart failure. Heart valve diseases. Heart surgery. Diabetes. Thyroid disease. Kidney disease. Lung diseases, such as pneumonia or COPD. Sleep apnea. Sometimes the cause is not known. What increases the risk? You are more likely to develop AFib if: You are older. You exercise often and very hard. You have a family history of AFib. You are female. You are Caucasian. You are overweight. You smoke. You drink a lot of alcohol. What are the signs or symptoms? Common symptoms of this condition include: A feeling that your heart is beating very fast. Chest pain or discomfort. Feeling short of breath. Suddenly feeling light-headed or weak. Getting tired easily during activity. Fainting. Sweating. In some cases, there are no symptoms. How is this treated? Medicines to: Prevent blood clots. Treat heart rate or heart rhythm problems. Using devices, such as a pacemaker, to correct heart rhythm problems. Doing surgery to remove the part of the heart that sends bad signals. Closing an area where clots can form in the heart (left atrial appendage). In some cases, your doctor will treat other underlying conditions. Follow these instructions at home: Medicines Take over-the-counter and prescription medicines only as told  by your doctor. Do not take any new medicines without first talking to your doctor. If you are taking blood thinners: Talk with your doctor before taking aspirin or NSAIDs, such as ibuprofen. Take your medicines as told. Take them at the same time each day. Do not do things that could hurt or bruise you. Be careful to avoid falls. Wear an alert bracelet or carry a card that says you take blood thinners. Lifestyle Do not smoke or use any products that contain nicotine or tobacco. If you need help quitting, ask your doctor. Eat heart-healthy foods. Talk with your doctor about the right eating plan for you. Exercise regularly as told by your doctor. Do not drink alcohol. Lose weight if you are overweight. General instructions If you have sleep apnea, treat it as told by your doctor. Do not use diet pills unless your doctor says they are safe for you. Diet pills may make heart problems worse. Keep all follow-up visits. Your doctor will check your heart rate and rhythm regularly. Contact a doctor if: You notice a change in the speed, rhythm, or strength of your heartbeat. You are taking a blood-thinning medicine and you get more bruising. You get tired more easily when you move or exercise. You have a sudden change in weight. Get help right away if:  You have pain in your chest. You have trouble breathing. You have side effects of blood thinners, such as blood in your vomit, poop (stool), or pee (urine), or bleeding that cannot stop. You have any signs of a stroke. "BE FAST" is an easy way to remember the main  warning signs: B - Balance. Dizziness, sudden trouble walking, or loss of balance. E - Eyes. Trouble seeing or a change in how you see. F - Face. Sudden weakness or loss of feeling in the face. The face or eyelid may droop on one side. A - Arms.Weakness or loss of feeling in an arm. This happens suddenly and usually on one side of the body. S - Speech. Sudden trouble speaking,  slurred speech, or trouble understanding what people say. T - Time.Time to call emergency services. Write down what time symptoms started. You have other signs of a stroke, such as: A sudden, very bad headache with no known cause. Feeling like you may vomit (nausea). Vomiting. A seizure. These symptoms may be an emergency. Get help right away. Call 911. Do not wait to see if the symptoms will go away. Do not drive yourself to the hospital. This information is not intended to replace advice given to you by your health care provider. Make sure you discuss any questions you have with your health care provider. Document Revised: 09/14/2021 Document Reviewed: 09/14/2021 Elsevier Patient Education  2024 [employer].

## 2022-08-11 NOTE — Progress Notes (Unsigned)
Jasmine Montgomery - 72 y.o. female MRN [id number]  Date of birth: 20-Aug-1950  Subjective Chief Complaint  Patient presents with  . Atrial Fibrillation  . Diabetes    HPI  No Known Allergies  Past Medical History:  Diagnosis Date  . A-fib (HCC)   . Cancer (HCC) 08/2019  . Diabetes mellitus without complication (HCC)   . History of breast cancer   . Hyperlipidemia   . Hypertension   . Stroke [facility])     Past Surgical History:  Procedure Laterality Date  . BREAST SURGERY  03/2020  . HERNIA REPAIR  2010  . SMALL INTESTINE SURGERY  03/2016    Social History   Socioeconomic History  . Marital status: Widowed    Spouse name: Not on file  . Number of children: 3  . Years of education: 84  . Highest education level: Associate degree: academic program  Occupational History  . Occupation: Retired    Comment: retired Engineer, civil (consulting)  Tobacco Use  . Smoking status: Every Day    Current packs/day: 0.50    Average packs/day: 0.5 packs/day for 15.0 years (7.5 ttl pk-yrs)    Types: Cigarettes  . Smokeless tobacco: Never  Vaping Use  . Vaping status: Never Used  Substance and Sexual Activity  . Alcohol use: Not Currently  . Drug use: Not Currently    Types: Marijuana  . Sexual activity: Not Currently    Partners: Female, Female    Birth control/protection: Post-menopausal  Other Topics Concern  . Not on file  Social History Narrative   Lives with her daughter and her family. She enjoys quilting and painting pillow cases.   Social Determinants of Health   Financial Resource Strain: Low Risk  (08/07/2022)   Overall Financial Resource Strain (CARDIA)   . Difficulty of Paying Living Expenses: Not hard at all  Food Insecurity: No Food Insecurity (08/07/2022)   Hunger Vital Sign   . Worried About Programme researcher, broadcasting/film/video in the Last Year: Never true   . Ran Out of Food in the Last Year: Never true  Transportation Needs: No Transportation Needs (08/07/2022)   PRAPARE - Transportation   .  Lack of Transportation (Medical): No   . Lack of Transportation (Non-Medical): No  Physical Activity: Inactive (08/07/2022)   Exercise Vital Sign   . Days of Exercise per Week: 0 days   . Minutes of Exercise per Session: 0 min  Stress: No Stress Concern Present (08/07/2022)   [employer] of Occupational Health - Occupational Stress Questionnaire   . Feeling of Stress : Not at all  Social Connections: Moderately Isolated (08/07/2022)   Social Connection and Isolation Panel [NHANES]   . Frequency of Communication with Friends and Family: Never   . Frequency of Social Gatherings with Friends and Family: More than three times a week   . Attends Religious Services: More than 4 times per year   . Active Member of Clubs or Organizations: No   . Attends Banker Meetings: Never   . Marital Status: Widowed    Family History  Problem Relation Age of Onset  . Breast cancer Sister   . Cancer Sister   . Diabetes Brother   . Hypertension Mother   . Miscarriages / [place] Mother   . Varicose Veins Mother   . Cancer Paternal Grandmother   . Obesity Paternal Grandmother     Health Maintenance  Topic Date Due  . Diabetic kidney evaluation - eGFR measurement  08/04/2022  .  Diabetic kidney evaluation - Urine ACR  08/04/2022  . INFLUENZA VACCINE  10/11/2022 (Originally 08/10/2022)  . Colonoscopy  10/11/2022 (Originally 04/05/1997)  . Zoster Vaccines- Shingrix (1 of 2) 11/07/2022 (Originally 04/06/1971)  . MAMMOGRAM  11/08/2022 (Originally 09/18/2021)  . COVID-19 Vaccine (5 - 2023-24 season) 02/24/2023 (Originally 09/09/2021)  . Hepatitis C Screening  08/07/2023 (Originally 04/06/1970)  . FOOT EXAM  09/07/2022  . OPHTHALMOLOGY EXAM  10/22/2022  . HEMOGLOBIN A1C  02/11/2023  . Medicare Annual Wellness (AWV)  08/07/2023  . DTaP/Tdap/Td (3 - Td or Tdap) 09/30/2025  . DEXA SCAN  01/18/2032  . Pneumonia Vaccine 81+ Years old  Completed  . HPV VACCINES  Aged Out      ----------------------------------------------------------------------------------------------------------------------------------------------------------------------------------------------------------------- Physical Exam BP 119/82 (BP Location: Left Arm, Patient Position: Sitting, Cuff Size: Large)   Pulse (!) 131   Ht 5' 5.5" (1.664 m)   Wt 228 lb (103.4 kg)   SpO2 96%   BMI 37.36 kg/m   Physical Exam   A. Fib with RVR  ------------------------------------------------------------------------------------------------------------------------------------------------------------------------------------------------------------------- Assessment and Plan  Paroxysmal atrial fibrillation (HCC) Noted to be in A. Fib w/ RVR today.  She is asymptomatic at this time.  Adding toprol xl 25mg  daily.  Checking electrolytes and TSH.  Her CHA2DS2-VASc Score is 6.  She does have remote history of ICH.  Did not require any intervention at that time when treated at [facility].  I think her increased risk of ischemic stroke warrants addition of anticoagulation.  I did go ahead and order an updated Echo.  She will follow up with her cardiologist at [facility] as well.           Type 2 diabetes mellitus without complication, without long-term current use of insulin (HCC) Blood sugars are better controlled.  Continue metformin with Rybelsus.  Encouraged continued dietary changes.     Meds ordered this encounter  Medications  . metoprolol succinate (TOPROL-XL) 25 MG 24 hr tablet    Sig: Take 1 tablet (25 mg total) by mouth daily. Take with or immediately following a meal.    Dispense:  90 tablet    Refill:  1  . apixaban (ELIQUIS) 5 MG TABS tablet    Sig: Take 1 tablet (5 mg total) by mouth 2 (two) times daily.    Dispense:  60 tablet    Refill:  3    Return in about 4 weeks (around 09/08/2022) for F/u A. fib.    This visit occurred during the SARS-CoV-2 public health emergency.  Safety  protocols were in place, including screening questions prior to the visit, additional usage of staff PPE, and extensive cleaning of exam room while observing appropriate contact time as indicated for disinfecting solutions.

## 2022-08-15 NOTE — Assessment & Plan Note (Signed)
Continue simvastatin at current strength.   

## 2022-08-17 NOTE — Addendum Note (Signed)
Addended by: Chalmers Cater on: 08/17/2022 03:42 PM   Modules accepted: Orders

## 2022-08-18 ENCOUNTER — Other Ambulatory Visit: Payer: Self-pay | Admitting: Family Medicine

## 2022-08-18 DIAGNOSIS — R7989 Other specified abnormal findings of blood chemistry: Secondary | ICD-10-CM

## 2022-08-27 ENCOUNTER — Other Ambulatory Visit: Payer: Self-pay | Admitting: Family Medicine

## 2022-09-12 ENCOUNTER — Ambulatory Visit (INDEPENDENT_AMBULATORY_CARE_PROVIDER_SITE_OTHER): Payer: Medicare Other | Admitting: Family Medicine

## 2022-09-12 ENCOUNTER — Encounter: Payer: Self-pay | Admitting: Family Medicine

## 2022-09-12 VITALS — BP 121/76 | HR 114 | Ht 65.5 in | Wt 231.0 lb

## 2022-09-12 DIAGNOSIS — I480 Paroxysmal atrial fibrillation: Secondary | ICD-10-CM | POA: Diagnosis not present

## 2022-09-12 DIAGNOSIS — I10 Essential (primary) hypertension: Secondary | ICD-10-CM

## 2022-09-12 NOTE — Progress Notes (Signed)
Jasmine Montgomery - 72 y.o. female MRN [id number]  Date of birth: [birth year]  Subjective Chief Complaint  Patient presents with   Atrial Fibrillation    HPI Jasmine Montgomery is a 72 y.o. female here today for follow up visit.   She reports that she is doing well.  She was in A. Fib at last visit.  Seen by cardiology recently and they are planning on cardioversion.  She denies new symptoms including chest pain, shortness of breath, palpitations, headache or vision changes.  Remains on metoprolol for rate control and eliquis for anti-coagulation.    BP is well controlled with current medications.   ROS:  A comprehensive ROS was completed and negative except as noted per HPI  No Known Allergies  Past Medical History:  Diagnosis Date   A-fib (HCC)    Cancer (HCC) 08/2019   Diabetes mellitus without complication (HCC)    History of breast cancer    Hyperlipidemia    Hypertension    Stroke [facility])     Past Surgical History:  Procedure Laterality Date   BREAST SURGERY  03/2020   HERNIA REPAIR  2010   SMALL INTESTINE SURGERY  03/2016    Social History   Socioeconomic History   Marital status: Widowed    Spouse name: Not on file   Number of children: 3   Years of education: 14   Highest education level: Associate degree: academic program  Occupational History   Occupation: Retired    Comment: retired Engineer, civil (consulting)  Tobacco Use   Smoking status: Every Day    Current packs/day: 0.50    Average packs/day: 0.5 packs/day for 15.0 years (7.5 ttl pk-yrs)    Types: Cigarettes   Smokeless tobacco: Never  Vaping Use   Vaping status: Never Used  Substance and Sexual Activity   Alcohol use: Not Currently   Drug use: Not Currently    Types: Marijuana   Sexual activity: Not Currently    Partners: Female, Female    Birth control/protection: Post-menopausal  Other Topics Concern   Not on file  Social History Narrative   Lives with her daughter and her family. She enjoys quilting and  painting pillow cases.   Social Determinants of Health   Financial Resource Strain: Low Risk  (08/07/2022)   Overall Financial Resource Strain (CARDIA)    Difficulty of Paying Living Expenses: Not hard at all  Food Insecurity: No Food Insecurity (08/07/2022)   Hunger Vital Sign    Worried About Running Out of Food in the Last Year: Never true    Ran Out of Food in the Last Year: Never true  Transportation Needs: No Transportation Needs (08/07/2022)   PRAPARE - Administrator, Civil Service (Medical): No    Lack of Transportation (Non-Medical): No  Physical Activity: Inactive (08/07/2022)   Exercise Vital Sign    Days of Exercise per Week: 0 days    Minutes of Exercise per Session: 0 min  Stress: No Stress Concern Present (08/07/2022)   [employer] of Occupational Health - Occupational Stress Questionnaire    Feeling of Stress : Not at all  Social Connections: Moderately Isolated (08/07/2022)   Social Connection and Isolation Panel [NHANES]    Frequency of Communication with Friends and Family: Never    Frequency of Social Gatherings with Friends and Family: More than three times a week    Attends Religious Services: More than 4 times per year    Active Member of Clubs or Organizations: No  Attends Banker Meetings: Never    Marital Status: Widowed    Family History  Problem Relation Age of Onset   Breast cancer Sister    Cancer Sister    Diabetes Brother    Hypertension Mother    Miscarriages / Stillbirths Mother    Varicose Veins Mother    Cancer Paternal Grandmother    Obesity Paternal Grandmother     Health Maintenance  Topic Date Due   INFLUENZA VACCINE  10/11/2022 (Originally 08/10/2022)   Colonoscopy  10/11/2022 (Originally 04/05/1997)   Zoster Vaccines- Shingrix (1 of 2) 11/07/2022 (Originally 04/06/1971)   MAMMOGRAM  11/08/2022 (Originally 09/18/2021)   COVID-19 Vaccine (5 - 2023-24 season) 11/28/2022 (Originally 09/10/2022)   Hepatitis C  Screening  08/07/2023 (Originally 04/06/1970)   OPHTHALMOLOGY EXAM  10/22/2022   HEMOGLOBIN A1C  02/11/2023   Medicare Annual Wellness (AWV)  08/07/2023   Diabetic kidney evaluation - Urine ACR  08/11/2023   Diabetic kidney evaluation - eGFR measurement  09/07/2023   FOOT EXAM  09/12/2023   DTaP/Tdap/Td (3 - Td or Tdap) 09/30/2025   DEXA SCAN  01/18/2032   Pneumonia Vaccine 66+ Years old  Completed   HPV VACCINES  Aged Out     ----------------------------------------------------------------------------------------------------------------------------------------------------------------------------------------------------------------- Physical Exam BP 121/76 (BP Location: Left Arm, Patient Position: Sitting, Cuff Size: Large)   Pulse (!) 114   Ht 5' 5.5" (1.664 m)   Wt 231 lb (104.8 kg)   SpO2 94%   BMI 37.86 kg/m   Physical Exam Constitutional:      Appearance: Normal appearance.  HENT:     Head: Normocephalic and atraumatic.  Eyes:     General: No scleral icterus. Cardiovascular:     Rate and Rhythm: Tachycardia present. Rhythm irregular.  Pulmonary:     Effort: Pulmonary effort is normal.     Breath sounds: Normal breath sounds.  Musculoskeletal:     Cervical back: Neck supple.  Neurological:     General: No focal deficit present.     Mental Status: She is alert.  Psychiatric:        Mood and Affect: Mood normal.        Behavior: Behavior normal.     ------------------------------------------------------------------------------------------------------------------------------------------------------------------------------------------------------------------- Assessment and Plan  Paroxysmal atrial fibrillation (HCC) Remains in A. Fib.  Has upcoming cardioversion.  Continue metoprolol and eliquis at current strength.    Essential hypertension BP is well controlled.  Will plan to continue current medications.     No orders of the defined types were placed in this  encounter.   No follow-ups on file.    This visit occurred during the SARS-CoV-2 public health emergency.  Safety protocols were in place, including screening questions prior to the visit, additional usage of staff PPE, and extensive cleaning of exam room while observing appropriate contact time as indicated for disinfecting solutions.

## 2022-09-12 NOTE — Assessment & Plan Note (Signed)
Remains in A. Fib.  Has upcoming cardioversion.  Continue metoprolol and eliquis at current strength.

## 2022-09-12 NOTE — Assessment & Plan Note (Signed)
BP is well controlled.  Will plan to continue current medications.

## 2022-09-14 ENCOUNTER — Other Ambulatory Visit: Payer: Self-pay | Admitting: Family Medicine

## 2022-09-26 ENCOUNTER — Other Ambulatory Visit: Payer: Self-pay | Admitting: Family Medicine

## 2022-09-26 DIAGNOSIS — E119 Type 2 diabetes mellitus without complications: Secondary | ICD-10-CM

## 2022-10-18 ENCOUNTER — Other Ambulatory Visit: Payer: Self-pay | Admitting: Family Medicine

## 2022-10-24 ENCOUNTER — Ambulatory Visit (INDEPENDENT_AMBULATORY_CARE_PROVIDER_SITE_OTHER): Payer: Medicare Other | Admitting: Family Medicine

## 2022-10-24 ENCOUNTER — Ambulatory Visit (INDEPENDENT_AMBULATORY_CARE_PROVIDER_SITE_OTHER): Payer: Medicare Other

## 2022-10-24 ENCOUNTER — Encounter: Payer: Self-pay | Admitting: Family Medicine

## 2022-10-24 VITALS — BP 141/83 | HR 113 | Resp 19 | Ht 65.0 in | Wt 229.0 lb

## 2022-10-24 DIAGNOSIS — R053 Chronic cough: Secondary | ICD-10-CM | POA: Insufficient documentation

## 2022-10-24 MED ORDER — HYDROCODONE BIT-HOMATROP MBR 5-1.5 MG/5ML PO SOLN: 5.0000 mL | Freq: Three times a day (TID) | ORAL | 0 refills | Status: DC | PRN

## 2022-10-24 MED ORDER — PANTOPRAZOLE SODIUM 40 MG PO TBEC: 40.0000 mg | DELAYED_RELEASE_TABLET | Freq: Every day | ORAL | 3 refills | Status: DC

## 2022-10-24 NOTE — Assessment & Plan Note (Signed)
2 view CXR ordered.  Adding protonix daily, may discontinue if no change after 2-3 weeks.  Hydromet cough syrup as needed.

## 2022-10-24 NOTE — Progress Notes (Signed)
Jasmine Montgomery - 72 y.o. female MRN [id number]  Date of birth: 13-May-1950  Subjective Chief Complaint  Patient presents with   Cough    Cough onset for 3 months     HPI Jasmine Montgomery is a 72 y.o. female here today with complaint of cough.  She has had cough for about 3 months.   Cough is productive of clear to white mucus.   She does have A. Fib.  Denies increased swelling.  She is a daily smoker.  She denies wheezing or dyspnea.  Worse at night and first thing in the morning.  Has tried flonase without relief.  Tessalon perles have not helped.  She has not noted reflux symptoms.    ROS:  A comprehensive ROS was completed and negative except as noted per HPI  No Known Allergies  Past Medical History:  Diagnosis Date   A-fib (HCC)    Cancer (HCC) 08/2019   Diabetes mellitus without complication (HCC)    History of breast cancer    Hyperlipidemia    Hypertension    Stroke [facility])     Past Surgical History:  Procedure Laterality Date   BREAST SURGERY  03/2020   HERNIA REPAIR  2010   SMALL INTESTINE SURGERY  03/2016    Social History   Socioeconomic History   Marital status: Widowed    Spouse name: Not on file   Number of children: 3   Years of education: 14   Highest education level: Associate degree: academic program  Occupational History   Occupation: Retired    Comment: retired Engineer, civil (consulting)  Tobacco Use   Smoking status: Every Day    Current packs/day: 0.50    Average packs/day: 0.5 packs/day for 15.0 years (7.5 ttl pk-yrs)    Types: Cigarettes   Smokeless tobacco: Never  Vaping Use   Vaping status: Never Used  Substance and Sexual Activity   Alcohol use: Not Currently   Drug use: Not Currently    Types: Marijuana   Sexual activity: Not Currently    Partners: Female, Female    Birth control/protection: Post-menopausal  Other Topics Concern   Not on file  Social History Narrative   Lives with her daughter and her family. She enjoys quilting and painting pillow  cases.   Social Determinants of Health   Financial Resource Strain: Low Risk  (08/07/2022)   Overall Financial Resource Strain (CARDIA)    Difficulty of Paying Living Expenses: Not hard at all  Food Insecurity: No Food Insecurity (08/07/2022)   Hunger Vital Sign    Worried About Running Out of Food in the Last Year: Never true    Ran Out of Food in the Last Year: Never true  Transportation Needs: No Transportation Needs (08/07/2022)   PRAPARE - Administrator, Civil Service (Medical): No    Lack of Transportation (Non-Medical): No  Physical Activity: Inactive (08/07/2022)   Exercise Vital Sign    Days of Exercise per Week: 0 days    Minutes of Exercise per Session: 0 min  Stress: No Stress Concern Present (08/07/2022)   [employer] of Occupational Health - Occupational Stress Questionnaire    Feeling of Stress : Not at all  Social Connections: Moderately Isolated (08/07/2022)   Social Connection and Isolation Panel [NHANES]    Frequency of Communication with Friends and Family: Never    Frequency of Social Gatherings with Friends and Family: More than three times a week    Attends Religious Services: More than 4  times per year    Active Member of Clubs or Organizations: No    Attends Banker Meetings: Never    Marital Status: Widowed    Family History  Problem Relation Age of Onset   Breast cancer Sister    Cancer Sister    Diabetes Brother    Hypertension Mother    Miscarriages / Stillbirths Mother    Varicose Veins Mother    Cancer Paternal Grandmother    Obesity Paternal Grandmother     Health Maintenance  Topic Date Due   Colonoscopy  Never done   OPHTHALMOLOGY EXAM  10/22/2022   Zoster Vaccines- Shingrix (1 of 2) 11/07/2022 (Originally 04/06/1971)   MAMMOGRAM  11/08/2022 (Originally 09/18/2021)   COVID-19 Vaccine (5 - 2023-24 season) 11/28/2022 (Originally 09/10/2022)   INFLUENZA VACCINE  04/09/2023 (Originally 08/10/2022)   Hepatitis C  Screening  08/07/2023 (Originally 04/06/1970)   HEMOGLOBIN A1C  02/11/2023   Medicare Annual Wellness (AWV)  08/07/2023   Diabetic kidney evaluation - Urine ACR  08/11/2023   Diabetic kidney evaluation - eGFR measurement  09/07/2023   FOOT EXAM  09/12/2023   DTaP/Tdap/Td (3 - Td or Tdap) 09/30/2025   DEXA SCAN  01/18/2032   Pneumonia Vaccine 71+ Years old  Completed   HPV VACCINES  Aged Out     ----------------------------------------------------------------------------------------------------------------------------------------------------------------------------------------------------------------- Physical Exam BP (!) 141/83   Pulse (!) 113   Resp 19   Ht 5\' 5"  (1.651 m)   Wt 229 lb (103.9 kg)   SpO2 96%   BMI 38.11 kg/m   Physical Exam Constitutional:      Appearance: Normal appearance.  Eyes:     General: No scleral icterus. Cardiovascular:     Rate and Rhythm: Normal rate and regular rhythm.  Pulmonary:     Effort: Pulmonary effort is normal.     Breath sounds: Normal breath sounds.  Musculoskeletal:     Cervical back: Neck supple.  Neurological:     General: No focal deficit present.     Mental Status: She is alert.  Psychiatric:        Mood and Affect: Mood normal.        Behavior: Behavior normal.     ------------------------------------------------------------------------------------------------------------------------------------------------------------------------------------------------------------------- Assessment and Plan  Chronic cough 2 view CXR ordered.  Adding protonix daily, may discontinue if no change after 2-3 weeks.  Hydromet cough syrup as needed.    Meds ordered this encounter  Medications   pantoprazole (PROTONIX) 40 MG tablet    Sig: Take 1 tablet (40 mg total) by mouth daily.    Dispense:  30 tablet    Refill:  3   HYDROcodone bit-homatropine (HYCODAN) 5-1.5 MG/5ML syrup    Sig: Take 5 mLs by mouth every 8 (eight) hours as needed  for cough.    Dispense:  120 mL    Refill:  0    No follow-ups on file.    This visit occurred during the SARS-CoV-2 public health emergency.  Safety protocols were in place, including screening questions prior to the visit, additional usage of staff PPE, and extensive cleaning of exam room while observing appropriate contact time as indicated for disinfecting solutions.

## 2022-10-27 ENCOUNTER — Other Ambulatory Visit: Payer: Self-pay | Admitting: Family Medicine

## 2022-10-27 DIAGNOSIS — R053 Chronic cough: Secondary | ICD-10-CM

## 2022-11-05 ENCOUNTER — Other Ambulatory Visit: Payer: Self-pay | Admitting: Family Medicine

## 2022-11-07 ENCOUNTER — Other Ambulatory Visit: Payer: Self-pay | Admitting: Family Medicine

## 2022-11-10 ENCOUNTER — Other Ambulatory Visit: Payer: Self-pay | Admitting: Family Medicine

## 2022-11-10 DIAGNOSIS — E1169 Type 2 diabetes mellitus with other specified complication: Secondary | ICD-10-CM

## 2022-11-21 LAB — HEMOGLOBIN A1C: Hemoglobin A1C: 7

## 2022-11-21 LAB — BASIC METABOLIC PANEL: Glucose: 154

## 2022-11-27 ENCOUNTER — Other Ambulatory Visit: Payer: Self-pay | Admitting: Family Medicine

## 2022-12-06 ENCOUNTER — Encounter: Payer: Self-pay | Admitting: Family Medicine

## 2022-12-06 ENCOUNTER — Ambulatory Visit (INDEPENDENT_AMBULATORY_CARE_PROVIDER_SITE_OTHER): Payer: Medicare Other | Admitting: Family Medicine

## 2022-12-06 VITALS — BP 147/86 | HR 111 | Ht 65.0 in | Wt 230.0 lb

## 2022-12-06 DIAGNOSIS — I480 Paroxysmal atrial fibrillation: Secondary | ICD-10-CM | POA: Diagnosis not present

## 2022-12-06 MED ORDER — LOSARTAN POTASSIUM 25 MG PO TABS: 25.0000 mg | ORAL_TABLET | Freq: Every day | ORAL | 1 refills | Status: DC

## 2022-12-06 NOTE — Progress Notes (Signed)
Jasmine Montgomery - 72 y.o. female MRN [id number]  Date of birth: [birth year]  Subjective Chief Complaint  Patient presents with   Hospitalization Follow-up    HPI Jasmine Montgomery is a 72 y.o. female here today for follow up of recent hospitalization.   She was initially hospitalized for Tikosyn loading.  This was started however she returned to A. Fib after the first night.  After a short washout period amiodarone was added.  Her lisinopril was discontinued due to chronic cough.  She remains on amiodarone and metoprolol.  She denies side effects from medication at this time.  She has not had symptoms related to her A. Fib.  Plan is to continue current medications and then do ablation after the first of the year.   ROS:  A comprehensive ROS was completed and negative except as noted per HPI   No Known Allergies  Past Medical History:  Diagnosis Date   A-fib (HCC)    Cancer (HCC) 08/2019   Diabetes mellitus without complication (HCC)    History of breast cancer    Hyperlipidemia    Hypertension    Stroke [facility])     Past Surgical History:  Procedure Laterality Date   BILATERAL TOTAL MASTECTOMY WITH AXILLARY LYMPH NODE DISSECTION  02/2020   BREAST SURGERY  03/2020   HERNIA REPAIR  2010   SMALL INTESTINE SURGERY  03/2016    Social History   Socioeconomic History   Marital status: Widowed    Spouse name: Not on file   Number of children: 3   Years of education: 14   Highest education level: Associate degree: academic program  Occupational History   Occupation: Retired    Comment: retired Engineer, civil (consulting)  Tobacco Use   Smoking status: Every Day    Current packs/day: 0.50    Average packs/day: 0.5 packs/day for 15.0 years (7.5 ttl pk-yrs)    Types: Cigarettes   Smokeless tobacco: Never  Vaping Use   Vaping status: Never Used  Substance and Sexual Activity   Alcohol use: Not Currently   Drug use: Not Currently    Types: Marijuana   Sexual activity: Not Currently    Partners:  Female, Female    Birth control/protection: Post-menopausal  Other Topics Concern   Not on file  Social History Narrative   Lives with her daughter and her family. She enjoys quilting and painting pillow cases.   Social Determinants of Health   Financial Resource Strain: Low Risk  (08/07/2022)   Overall Financial Resource Strain (CARDIA)    Difficulty of Paying Living Expenses: Not hard at all  Food Insecurity: No Food Insecurity (08/07/2022)   Hunger Vital Sign    Worried About Running Out of Food in the Last Year: Never true    Ran Out of Food in the Last Year: Never true  Transportation Needs: No Transportation Needs (08/07/2022)   PRAPARE - Administrator, Civil Service (Medical): No    Lack of Transportation (Non-Medical): No  Physical Activity: Inactive (08/07/2022)   Exercise Vital Sign    Days of Exercise per Week: 0 days    Minutes of Exercise per Session: 0 min  Stress: No Stress Concern Present (08/07/2022)   [employer] of Occupational Health - Occupational Stress Questionnaire    Feeling of Stress : Not at all  Social Connections: Unknown (10/31/2022)   Received from [facility]   Social Network    Social Network: Not on file  Recent Concern: Social Connections - Moderately  Isolated (08/07/2022)   Social Connection and Isolation Panel [NHANES]    Frequency of Communication with Friends and Family: Never    Frequency of Social Gatherings with Friends and Family: More than three times a week    Attends Religious Services: More than 4 times per year    Active Member of [employer] or Organizations: No    Attends Banker Meetings: Never    Marital Status: Widowed    Family History  Problem Relation Age of Onset   Breast cancer Sister    Cancer Sister    Diabetes Brother    Hypertension Mother    Miscarriages / Stillbirths Mother    Varicose Veins Mother    Cancer Paternal Grandmother    Obesity Paternal Grandmother     Health Maintenance   Topic Date Due   Zoster Vaccines- Shingrix (1 of 2) Never done   Colonoscopy  Never done   MAMMOGRAM  09/18/2021   COVID-19 Vaccine (5 - 2023-24 season) 09/10/2022   OPHTHALMOLOGY EXAM  10/22/2022   INFLUENZA VACCINE  04/09/2023 (Originally 08/10/2022)   Hepatitis C Screening  08/07/2023 (Originally 04/06/1970)   HEMOGLOBIN A1C  05/21/2023   Medicare Annual Wellness (AWV)  08/07/2023   Diabetic kidney evaluation - Urine ACR  08/11/2023   FOOT EXAM  09/12/2023   Diabetic kidney evaluation - eGFR measurement  11/21/2023   DTaP/Tdap/Td (3 - Td or Tdap) 09/30/2025   DEXA SCAN  01/18/2032   Pneumonia Vaccine 21+ Years old  Completed   HPV VACCINES  Aged Out     ----------------------------------------------------------------------------------------------------------------------------------------------------------------------------------------------------------------- Physical Exam BP (!) 147/86 (BP Location: Right Arm, Patient Position: Sitting, Cuff Size: Large)   Pulse (!) 111   Ht 5\' 5"  (1.651 m)   Wt 230 lb (104.3 kg)   SpO2 96%   BMI 38.27 kg/m   Physical Exam Constitutional:      Appearance: Normal appearance.  Cardiovascular:     Rate and Rhythm: Normal rate and regular rhythm.  Pulmonary:     Effort: Pulmonary effort is normal.     Breath sounds: Normal breath sounds.  Neurological:     General: No focal deficit present.     Mental Status: She is alert.  Psychiatric:        Mood and Affect: Mood normal.        Behavior: Behavior normal.     ------------------------------------------------------------------------------------------------------------------------------------------------------------------------------------------------------------------- Assessment and Plan  Paroxysmal atrial fibrillation (HCC) Remains in a.fib but is asymptomatic.  She will continue amiodarone and metoprolol.  Continue anticoagulation with Eliquis.  Planning for ablation in January.   Checking baseling TFT's with amiodarone.  Update BMP/CBC   Meds ordered this encounter  Medications   losartan (COZAAR) 25 MG tablet    Sig: Take 1 tablet (25 mg total) by mouth daily.    Dispense:  90 tablet    Refill:  1    Return in about 3 weeks (around 12/27/2022) for nurse visit for BP check.    This visit occurred during the SARS-CoV-2 public health emergency.  Safety protocols were in place, including screening questions prior to the visit, additional usage of staff PPE, and extensive cleaning of exam room while observing appropriate contact time as indicated for disinfecting solutions.

## 2022-12-06 NOTE — Assessment & Plan Note (Signed)
Remains in a.fib but is asymptomatic.  She will continue amiodarone and metoprolol.  Continue anticoagulation with Eliquis.  Planning for ablation in January.  Checking baseling TFT's with amiodarone.  Update BMP/CBC

## 2022-12-06 NOTE — Patient Instructions (Signed)
Start losartan to replace lisinopril.

## 2022-12-07 LAB — CBC WITH DIFFERENTIAL/PLATELET
Basophils Absolute: 0.1 10*3/uL (ref 0.0–0.2)
Basos: 1 %
EOS (ABSOLUTE): 0.2 10*3/uL (ref 0.0–0.4)
Eos: 3 %
Hematocrit: 45.1 % (ref 34.0–46.6)
Hemoglobin: 15.2 g/dL (ref 11.1–15.9)
Immature Grans (Abs): 0 10*3/uL (ref 0.0–0.1)
Immature Granulocytes: 0 %
Lymphocytes Absolute: 1.3 10*3/uL (ref 0.7–3.1)
Lymphs: 21 %
MCH: 30 pg (ref 26.6–33.0)
MCHC: 33.7 g/dL (ref 31.5–35.7)
MCV: 89 fL (ref 79–97)
Monocytes Absolute: 0.3 10*3/uL (ref 0.1–0.9)
Monocytes: 5 %
Neutrophils Absolute: 4.5 10*3/uL (ref 1.4–7.0)
Neutrophils: 70 %
Platelets: 253 10*3/uL (ref 150–450)
RBC: 5.06 x10E6/uL (ref 3.77–5.28)
RDW: 13.3 % (ref 11.7–15.4)
WBC: 6.5 10*3/uL (ref 3.4–10.8)

## 2022-12-07 LAB — BASIC METABOLIC PANEL
BUN/Creatinine Ratio: 14 (ref 12–28)
BUN: 14 mg/dL (ref 8–27)
CO2: 23 mmol/L (ref 20–29)
Calcium: 9.4 mg/dL (ref 8.7–10.3)
Chloride: 99 mmol/L (ref 96–106)
Creatinine, Ser: 1 mg/dL (ref 0.57–1.00)
Glucose: 109 mg/dL — ABNORMAL HIGH (ref 70–99)
Potassium: 4.2 mmol/L (ref 3.5–5.2)
Sodium: 141 mmol/L (ref 134–144)
eGFR: 61 mL/min/{1.73_m2} (ref >59)

## 2022-12-07 LAB — TSH+FREE T4
Free T4: 1.15 ng/dL (ref 0.82–1.77)
TSH: 9.64 u[IU]/mL — ABNORMAL HIGH (ref 0.450–4.500)

## 2022-12-21 ENCOUNTER — Other Ambulatory Visit: Payer: Self-pay | Admitting: Family Medicine

## 2023-01-01 ENCOUNTER — Ambulatory Visit (INDEPENDENT_AMBULATORY_CARE_PROVIDER_SITE_OTHER): Payer: Medicare Other

## 2023-01-01 VITALS — BP 125/58 | HR 50 | Ht 65.0 in

## 2023-01-01 DIAGNOSIS — I10 Essential (primary) hypertension: Secondary | ICD-10-CM

## 2023-01-01 NOTE — Patient Instructions (Signed)
continue with current medication regimen and schedule a follow up visit for March 2025.

## 2023-01-01 NOTE — Progress Notes (Signed)
   Established Patient Office Visit  Subjective   Patient ID: Jasmine Montgomery, female    DOB: 1950-07-26  Age: 72 y.o. MRN: [id number]  Chief Complaint  Patient presents with   Hypertension    BP check nurse visit-  patient currently in A-Fib - scheduled for ablation either Jan 2nd or Jan 3rd.     HPI  BP check nurse visit. Patient denies  chest  pain, shortness of breath, dizziness, palpitations, or medication problems. She is currently in A-Fib and is scheduled for ablation on either Jan2nd or Jan 3rd 2025.  Patient states she is no longer taking Lisinopril .   ROS    Objective:     BP (!) 134/48 (BP Location: Left Arm, Patient Position: Sitting, Cuff Size: Large)   Pulse (!) 50   Ht 5\' 5"  (1.651 m)   SpO2 98%   BMI 38.27 kg/m    Physical Exam   No results found for any visits on 01/01/23.    The ASCVD Risk score (Arnett DK, et al., 2019) failed to calculate for the following reasons:   Risk score cannot be calculated because patient has a medical history suggesting prior/existing ASCVD    Assessment & Plan:  BP check nurse visit- initial reading 134/48 and second reading 125/58. Per Dr. Ashley Royalty, continue with current medication regimen and schedule a follow up visit for March 2025.  Problem List Items Addressed This Visit   None   No follow-ups on file.    Elizabeth Palau, LPN

## 2023-01-08 ENCOUNTER — Other Ambulatory Visit: Payer: Self-pay | Admitting: Family Medicine

## 2023-01-11 DIAGNOSIS — E119 Type 2 diabetes mellitus without complications: Secondary | ICD-10-CM | POA: Diagnosis not present

## 2023-01-11 DIAGNOSIS — Z7984 Long term (current) use of oral hypoglycemic drugs: Secondary | ICD-10-CM | POA: Diagnosis not present

## 2023-01-11 DIAGNOSIS — I4819 Other persistent atrial fibrillation: Secondary | ICD-10-CM | POA: Diagnosis not present

## 2023-01-11 DIAGNOSIS — Z7901 Long term (current) use of anticoagulants: Secondary | ICD-10-CM | POA: Diagnosis not present

## 2023-01-11 NOTE — Anesthesia Preprocedure Evaluation (Addendum)
 Patient: Render [facility]  Procedure Information     Date/Time: 01/11/23 1613   Procedure: CV ABLATION ATRIAL FIBRILLATION - GB/GA/CARTO;BOS NONE Hold Eliquis  day of procedure, GLP-1 RA- if weekly, hold x 7 days, if daily, hold x 24 hours  PFA   Location: [facility] EP LAB 2 / [facility] EP Lab   Providers: Zachary Ozell [place], MD       Relevant Problems  CARDIOVASCULAR  (+) Atrial fibrillation (CMD)  (+) Essential hypertension  (+) Paroxysmal atrial fibrillation (HCC)    ENDOCRINE  (+) Type 2 diabetes mellitus without complication, without long-term current use of insulin (HCC)    RESPIRATORY SYSTEM  (+) Chronic bronchitis (HCC)    PREGNANCY ASSOCIATED DISEASES  (+) On dofetilide therapy    There were no vitals taken for this visit.   Clinical information reviewed:      Anesthesia Evaluation  PONV Predictive Score (Scale 0-5): (+) Female, History of PONV and History of Motion Sickness Apfel risk score: 3 Cardiovascular: Patient has high blood pressure and dysrhythmias.  Neurological: Patient has CVA.  Endocrine: Patient has type 2 diabetes.  Other: Patient has obesity.     Physical Exam  Airway  Mallampati: II TM Distance (FB): 3 Oral Aperture (FB): 3 Neck ROM: full ROM Cardiovascular - normal exam Pulmonary - normal exam   Anesthesia Plan  Review Preop documentation reviewed: H&P reviewed, Surgeon's Note Reviewed, Blood Availability Reviewed, NPO Status Reviewed, Preop Vitals Reviewed, Previous Anesthesia Records Reviewed and Periop Tests and Results Reviewed Comments: I have performed my preanesthesia assessment and reevaluated this patient immediately prior to this procedure. I have reviewed the patient's medical, surgical, and anesthetic history and confirmed surgical site and blood product availability as it applies. I have reviewed current medication(s), drug allergies, lab results, ancillary studies, and consultation within the EMR. Medication  guidelines appropriate for surgical urgency have been followed. I have confirmed NPO status as applicable to the relative urgency of this case. I have confirmed the ASA status of this patient. Anesthesia risks, benefits, and alternatives have been discussed with this patient or the patient's legal representative.  I have discussed the anesthetic plan with the Anesthesia Resident and/or CRNA and/or SRNA prior to initiating this anesthetic.  Christopher SAUNDERS. Gretta, MD, FASA, FASE, MS-HQSM  Plan ASA score: 3  Anesthesia type: general Induction: intravenous Post-op plan: Extubation in OR and PACU  Informed Consent Anesthetic plan and risks discussed with patient. Use of blood products discussed with patient whom consented to blood products. Plan discussed with CRNA.

## 2023-01-13 DIAGNOSIS — I4891 Unspecified atrial fibrillation: Secondary | ICD-10-CM | POA: Diagnosis not present

## 2023-01-13 DIAGNOSIS — I499 Cardiac arrhythmia, unspecified: Secondary | ICD-10-CM | POA: Diagnosis not present

## 2023-01-31 ENCOUNTER — Other Ambulatory Visit: Payer: Self-pay | Admitting: Family Medicine

## 2023-01-31 MED ORDER — OSELTAMIVIR PHOSPHATE 75 MG PO CAPS: 75.0000 mg | ORAL_CAPSULE | Freq: Every day | ORAL | 0 refills | Status: AC

## 2023-02-02 DIAGNOSIS — Z79899 Other long term (current) drug therapy: Secondary | ICD-10-CM | POA: Diagnosis not present

## 2023-02-02 DIAGNOSIS — I4819 Other persistent atrial fibrillation: Secondary | ICD-10-CM | POA: Diagnosis not present

## 2023-02-02 DIAGNOSIS — Z9889 Other specified postprocedural states: Secondary | ICD-10-CM | POA: Diagnosis not present

## 2023-02-02 DIAGNOSIS — Z8679 Personal history of other diseases of the circulatory system: Secondary | ICD-10-CM | POA: Diagnosis not present

## 2023-02-02 DIAGNOSIS — Z7901 Long term (current) use of anticoagulants: Secondary | ICD-10-CM | POA: Diagnosis not present

## 2023-02-05 DIAGNOSIS — I4819 Other persistent atrial fibrillation: Secondary | ICD-10-CM | POA: Diagnosis not present

## 2023-02-17 ENCOUNTER — Other Ambulatory Visit: Payer: Self-pay | Admitting: Family Medicine

## 2023-02-26 ENCOUNTER — Encounter: Payer: Self-pay | Admitting: Family Medicine

## 2023-02-26 ENCOUNTER — Ambulatory Visit (INDEPENDENT_AMBULATORY_CARE_PROVIDER_SITE_OTHER): Payer: No Typology Code available for payment source | Admitting: Family Medicine

## 2023-02-26 VITALS — BP 146/74 | HR 75 | Ht 65.0 in | Wt 223.0 lb

## 2023-02-26 DIAGNOSIS — E559 Vitamin D deficiency, unspecified: Secondary | ICD-10-CM

## 2023-02-26 DIAGNOSIS — G250 Essential tremor: Secondary | ICD-10-CM | POA: Diagnosis not present

## 2023-02-26 DIAGNOSIS — R413 Other amnesia: Secondary | ICD-10-CM | POA: Diagnosis not present

## 2023-02-26 MED ORDER — PRIMIDONE 50 MG PO TABS
ORAL_TABLET | ORAL | 3 refills | Status: DC
Start: 2023-02-26 — End: 2023-06-18

## 2023-02-26 NOTE — Assessment & Plan Note (Signed)
Rechecking TSH, B12 and vitamin D.  Discussed referral to neurology lab testing is negative.

## 2023-02-26 NOTE — Assessment & Plan Note (Signed)
She has not had a whole lot of relief beta-blocker.  Will add low strength primidone which she can titrate weekly as needed.

## 2023-02-26 NOTE — Patient Instructions (Signed)
 Problems With Thinking and Memory (Mild Neurocognitive Disorder): What to Know Mild neurocognitive disorder, formerly known as mild cognitive impairment, is a disorder where your memory doesn't work as well as it should. It may also affect other mental abilities like thinking, communicating, behavior, and being able to finish tasks. These problems can be noticed and measured. But they usually don't stop you from doing daily activities or living on your own. Mild neurocognitive disorder usually happens after 71 years of age. But it can also happen at younger ages. It's not as serious as major neurocognitive disorder, also known as dementia, but it may be the first sign of it. In general, the symptoms of this condition get worse over time. In rare cases, symptoms can get better. What are the causes? This condition may be caused by: Brain disorders like Alzheimer's disease, Parkinson's disease, and other conditions that slowly damage nerve cells. Diseases that affect the blood vessels in the brain and cause small strokes. Certain infections, like HIV. Traumatic brain injury. Other medical conditions, such as brain tumors, underactive thyroid (hypothyroidism), and not having enough vitamin B12. Using certain drugs or medicines. What increases the risk? Being older than 71 years of age. Being female. Having a lower level of education. Diabetes, high blood pressure, high cholesterol, and other conditions that raise the risk for blood vessel diseases. Untreated or undertreated sleep apnea. Having a certain type of gene that can be inherited, or passed down from parent to child. Long-term health problems like heart disease, lung disease, liver disease, kidney disease, or depression. What are the signs or symptoms? Trouble remembering things. You may: Forget names, phone numbers, or details of recent events. Forget about social events and appointments. Often forget where you put your car keys or other  items. Trouble thinking and solving problems. You may have trouble with complex tasks like: Paying bills. Driving in places you don't know well. Trouble communicating. You may have trouble: Finding the right word or naming an object. Forming a sentence that makes sense. Understanding what you read or hear. Changes in your behavior or personality. When this happens, you may: Lose interest in the things you used to enjoy. Avoid being around people. Get angry more easily than usual. Act before thinking. How is this diagnosed? This condition is diagnosed based on: Your symptoms. Your health care provider may ask you and the people you spend time with, like family and friends, about your symptoms. Memory tests and other tests to check how your brain is working. Your provider may refer you to a provider called a neurologist or a mental health specialist. To try to find out the cause of your condition, your provider may: Get a detailed medical history. Ask about use of alcohol, drugs, and medicines. Do a physical exam. Order blood tests and brain imaging tests. How is this treated? Mild neurocognitive disorder that's caused by medicine use, drug use, infection, or another medical condition may get better when the cause is treated, or when medicines or drugs are stopped. If this disorder has another cause, it usually doesn't improve and may get worse. In these cases, the goal of treatment is to help you manage the symptoms. This may include: Medicines to help with memory and behavior symptoms. Talk therapy. This provides education, emotional support, memory aids, and other ways of making up for problems with mental tasks. Lifestyle changes. These may include: Getting regular exercise. Eating a healthy diet that includes omega-3 fatty acids. Doing things to challenge your thinking  and memory skills. Spending more time being with and talking to other people. Using routines like having regular  times for meals and going to bed. Follow these instructions at home: Eating and drinking  Drink more fluids as told. Eat a healthy diet that includes omega-3 fatty acids. These can be found in: Fish. Nuts. Leafy vegetables. Vegetable oils. If you drink alcohol: Limit how much you have to: 0-1 drink a day if you're female. 0-2 drinks a day if you're female. Know how much alcohol is in your drink. In the [place] one drink is one 12 oz bottle of beer (355 mL), one 5 oz glass of wine (148 mL), or one 1 oz glass of hard liquor (44 mL). Lifestyle  Get regular exercise as told by your provider. Do not smoke, vape, or use nicotine or tobacco. Use healthy ways to manage stress. If you need help managing stress, ask your provider. Keep spending time with other people. Keep your mind active by doing activities you enjoy, like reading or playing games. Make sure you get good sleep at night. These tips can help: Try not to take naps during the day. Keep your bedroom dark and cool. Do not exercise in the few hours before you go to bed. Do not have foods or drinks with caffeine at night. General instructions Take medicines only as told. Your provider may tell you to avoid taking medicines that can affect thinking. These include some medicines for pain or sleeping. Work with your provider to find out: What things you need help with. What your safety needs are. Where to find more information [employer] on Aging: [email or web address] Contact a health care provider if: You have any new symptoms. Get help right away if: You have new confusion or your confusion gets worse. You act in ways that put you or your family in danger. This information is not intended to replace advice given to you by your health care provider. Make sure you discuss any questions you have with your health care provider. Document Revised: 06/20/2022 Document Reviewed: 06/20/2022 Elsevier Patient Education  2024 [employer].

## 2023-02-26 NOTE — Progress Notes (Signed)
Jasmine Montgomery - 73 y.o. female MRN [id number]  Date of birth: 11-12-1950  Subjective Chief Complaint  Patient presents with   Memory Loss    HPI Jasmine Montgomery is a 73 y.o. female here today for follow-up visit.  She reports that family embers have told her that they have noted some cognitive change.  Reports that she is more forgetful at times.  She does not really notice any significant deficits.  She does admit that she is forgetful from time to time but does not really affect her day-to-day activity.  There is no family history of dementia.  She does admit to some stressors but does not feel overly depressed.  Does have some anxiety.  Has noted increased essential tremor in the right hand.  She has had this for quite some time but worsened recently.  She is on a beta-blocker but has not noted any significant improvement with this.  ROS:  A comprehensive ROS was completed and negative except as noted per HPI  No Known Allergies  Past Medical History:  Diagnosis Date   A-fib (HCC)    Cancer (HCC) 08/2019   Diabetes mellitus without complication (HCC)    History of breast cancer    Hyperlipidemia    Hypertension    Stroke [facility])     Past Surgical History:  Procedure Laterality Date   BILATERAL TOTAL MASTECTOMY WITH AXILLARY LYMPH NODE DISSECTION  02/2020   BREAST SURGERY  03/2020   HERNIA REPAIR  2010   SMALL INTESTINE SURGERY  03/2016    Social History   Socioeconomic History   Marital status: Widowed    Spouse name: Not on file   Number of children: 3   Years of education: 14   Highest education level: Associate degree: academic program  Occupational History   Occupation: Retired    Comment: retired Engineer, civil (consulting)  Tobacco Use   Smoking status: Every Day    Current packs/day: 0.50    Average packs/day: 0.5 packs/day for 15.0 years (7.5 ttl pk-yrs)    Types: Cigarettes   Smokeless tobacco: Never  Vaping Use   Vaping status: Never Used  Substance and Sexual  Activity   Alcohol use: Not Currently   Drug use: Not Currently    Types: Marijuana   Sexual activity: Not Currently    Partners: Female, Female    Birth control/protection: Post-menopausal  Other Topics Concern   Not on file  Social History Narrative   Lives with her daughter and her family. She enjoys quilting and painting pillow cases.   Social Drivers of Corporate investment banker Strain: Low Risk  (08/07/2022)   Overall Financial Resource Strain (CARDIA)    Difficulty of Paying Living Expenses: Not hard at all  Food Insecurity: Low Risk  (02/02/2023)   Received from [facility]   Hunger Vital Sign    Worried About Running Out of Food in the Last Year: Never true    Ran Out of Food in the Last Year: Never true  Transportation Needs: No Transportation Needs (02/02/2023)   Received from [facility]    In the past 12 months, has lack of reliable transportation kept you from medical appointments, meetings, work or from getting things needed for daily living? : No  Physical Activity: Inactive (08/07/2022)   Exercise Vital Sign    Days of Exercise per Week: 0 days    Minutes of Exercise per Session: 0 min  Stress: No Stress Concern Present (08/07/2022)  [employer] of Occupational Health - Occupational Stress Questionnaire    Feeling of Stress : Not at all  Social Connections: Unknown (10/31/2022)   Received from [facility]   Social Network    Social Network: Not on file  Recent Concern: Social Connections - Moderately Isolated (08/07/2022)   Social Connection and Isolation Panel [NHANES]    Frequency of Communication with Friends and Family: Never    Frequency of Social Gatherings with Friends and Family: More than three times a week    Attends Religious Services: More than 4 times per year    Active Member of [employer] or Organizations: No    Attends Banker Meetings: Never    Marital Status: Widowed    Family History  Problem Relation  Age of Onset   Breast cancer Sister    Cancer Sister    Diabetes Brother    Hypertension Mother    Miscarriages / Stillbirths Mother    Varicose Veins Mother    Cancer Paternal Grandmother    Obesity Paternal Grandmother     Health Maintenance  Topic Date Due   Zoster Vaccines- Shingrix (1 of 2) Never done   Colonoscopy  Never done   OPHTHALMOLOGY EXAM  10/22/2022   INFLUENZA VACCINE  04/09/2023 (Originally 08/10/2022)   Hepatitis C Screening  08/07/2023 (Originally 04/06/1968)   COVID-19 Vaccine (5 - 2024-25 season) 03/13/2024 (Originally 09/10/2022)   HEMOGLOBIN A1C  05/21/2023   Medicare Annual Wellness (AWV)  08/07/2023   Diabetic kidney evaluation - Urine ACR  08/11/2023   FOOT EXAM  09/12/2023   Diabetic kidney evaluation - eGFR measurement  12/06/2023   DTaP/Tdap/Td (3 - Td or Tdap) 09/30/2025   DEXA SCAN  01/18/2032   Pneumonia Vaccine 77+ Years old  Completed   HPV VACCINES  Aged Out     ----------------------------------------------------------------------------------------------------------------------------------------------------------------------------------------------------------------- Physical Exam BP (!) 146/74 (BP Location: Left Arm, Patient Position: Sitting, Cuff Size: Large)   Pulse 75   Ht 5\' 5"  (1.651 m)   Wt 223 lb (101.2 kg)   SpO2 97%   BMI 37.11 kg/m   Physical Exam Constitutional:      Appearance: Normal appearance.  HENT:     Head: Normocephalic and atraumatic.  Cardiovascular:     Rate and Rhythm: Normal rate and regular rhythm.  Musculoskeletal:     Cervical back: Neck supple.  Neurological:     Mental Status: She is alert.     Comments: Tremor right hand worsens with purposeful movement.  Psychiatric:        Mood and Affect: Mood normal.        Behavior: Behavior normal.      ------------------------------------------------------------------------------------------------------------------------------------------------------------------------------------------------------------------- Assessment and Plan  Essential tremor She has not had a whole lot of relief beta-blocker.  Will add low strength primidone which she can titrate weekly as needed.  Memory change Rechecking TSH, B12 and vitamin D.  Discussed referral to neurology lab testing is negative.   Meds ordered this encounter  Medications   primidone (MYSOLINE) 50 MG tablet    Sig: Take 50mg  at bedtime.  May increase by 25mg  (1/2 tab) each week to max of 150mg /daily    Dispense:  90 tablet    Refill:  3    No follow-ups on file.    This visit occurred during the SARS-CoV-2 public health emergency.  Safety protocols were in place, including screening questions prior to the visit, additional usage of staff PPE, and extensive cleaning of exam room while observing  appropriate contact time as indicated for disinfecting solutions.

## 2023-02-27 ENCOUNTER — Encounter: Payer: Self-pay | Admitting: Family Medicine

## 2023-02-27 LAB — VITAMIN B12: Vitamin B-12: >2000 pg/mL — ABNORMAL HIGH (ref 232–1245)

## 2023-02-27 LAB — TSH+FREE T4
Free T4: 1.43 ng/dL (ref 0.82–1.77)
TSH: 4.27 u[IU]/mL (ref 0.450–4.500)

## 2023-02-27 LAB — VITAMIN D 25 HYDROXY (VIT D DEFICIENCY, FRACTURES): Vit D, 25-Hydroxy: 52 ng/mL (ref 30.0–100.0)

## 2023-02-28 ENCOUNTER — Other Ambulatory Visit: Payer: Self-pay | Admitting: Family Medicine

## 2023-03-19 ENCOUNTER — Encounter: Payer: Self-pay | Admitting: Family Medicine

## 2023-03-19 ENCOUNTER — Other Ambulatory Visit: Payer: Self-pay

## 2023-03-19 ENCOUNTER — Other Ambulatory Visit: Payer: Self-pay | Admitting: Family Medicine

## 2023-03-19 DIAGNOSIS — R413 Other amnesia: Secondary | ICD-10-CM

## 2023-03-19 DIAGNOSIS — F321 Major depressive disorder, single episode, moderate: Secondary | ICD-10-CM

## 2023-03-20 ENCOUNTER — Other Ambulatory Visit: Payer: Self-pay | Admitting: Family Medicine

## 2023-03-20 DIAGNOSIS — E119 Type 2 diabetes mellitus without complications: Secondary | ICD-10-CM

## 2023-03-29 ENCOUNTER — Ambulatory Visit: Payer: Medicare Other | Admitting: Family Medicine

## 2023-03-29 DIAGNOSIS — C50011 Malignant neoplasm of nipple and areola, right female breast: Secondary | ICD-10-CM | POA: Diagnosis not present

## 2023-03-29 DIAGNOSIS — Z170 Estrogen receptor positive status [ER+]: Secondary | ICD-10-CM | POA: Diagnosis not present

## 2023-04-10 ENCOUNTER — Other Ambulatory Visit: Payer: Self-pay | Admitting: Family Medicine

## 2023-05-01 ENCOUNTER — Ambulatory Visit (INDEPENDENT_AMBULATORY_CARE_PROVIDER_SITE_OTHER): Admitting: Licensed Clinical Social Worker

## 2023-05-01 DIAGNOSIS — F411 Generalized anxiety disorder: Secondary | ICD-10-CM

## 2023-05-01 NOTE — Progress Notes (Addendum)
 Comprehensive Clinical Assessment (CCA) Note  05/01/2023 Jasmine Montgomery [id number]  Chief Complaint:  Chief Complaint  Patient presents with   Anxiety   irritability   Visit Diagnosis: Anxiety state  CCA Biopsychosocial Intake/Chief Complaint:  doesn't know what wants to work on kids said she needs to talk to therapist. Feels like sometimes becoming grandmother meaning getting irritated easily at the same time likes being a grandmother. Sees herself getting more irritable. Trigger behavioral problems of grandkids. They have good parents and they are just kids but still can get patient irritable.  Current Symptoms/Problems: anxiety, irritabilty   Patient Reported Schizophrenia/Schizoaffective Diagnosis in Past: No   Strengths: keep a clean kitchen. sew and paint pillow cases hard for her has shake in her hand. PCP tried on meds hasn't taken it away not sure if it would.  Preferences: see above  Abilities: see above-reading   Type of Services Patient Feels are Needed: therapy   Initial Clinical Notes/Concerns: Mental health treatment-been to a therapist 5-6 years ago. Not sure then why had to go. Reviewed PCP note where indicated anxiety depression patient cannot remember that. She is a cancer survivor.Diagnosed in 2022. Ended up with one lump and one mass in right breast said take breast off didn't "want to wait for the other shoe to fall". Had a port put in when did that when into atrial fib. Dealt with that nothing happened until last year.didn't need any medication didn't have "bouts" until last year. Wore a monitor. when first diagnosed had to wear monitor 14 days/ then last year 7 days. Went through where had to zap heart to get in back in [place]ymne. Lasted to next day put in hospital for 3 days and then a month or two later in November in hospital. They were starting on med hoping it would take care didn't. They did a cardioversion twice one right after the other before left  hospital. Told her the next thing would have to do put a line through groin to heart and get rid of the part that is causing problem. They did that. Done on January 2. Before started on cardiac med supposed to help correct rhythm. Didn't work not going to do anything to beginning  of year had a family trip. Knew when back procedure done.four hour procedure. Outcome is good goes to see cardiologist on Monday do an EKG and see how doing regular ever since probably take her off the cardiac med to correct rhythm, will take another medication helping to regulate rhythm. Dealing with for five years. Living with son when diagnosed and he didn't want to tell her that he was going to ask her to leave. Explored why patient guesses he didn't want her there when sick. Told him would not have chosen him as primary care giver and two could be laying two weeks and nobody she was dead nobody would check her out. Depression-cont-cries every once in awhile feels sorry for herself. Patient is 73 having issues with remembering words have to give her her keys, independence. Medical-see before also diabetic   Mental Health Symptoms Depression:  Change in energy/activity; Irritability; Sleep (too much or little); Tearfulness; Weight gain/loss (change in energy with cardiac now good. Sometimes can't sleep. Not a person mourn herself and no reason to be here. Enjoy daughter and grandkids.)   Duration of Depressive symptoms: n/a  Mania:  None   Anxiety:   Sleep; Irritability; Worrying; Restlessness; Tension (room upstairs how going to climb upstairs when can't main  anxiety issue, has become more energetic kids say anxiety issues.)   Psychosis:  None   Duration of Psychotic symptoms: n/a  Trauma:  None   Obsessions:  None   Compulsions:  None   Inattention:  None   Hyperactivity/Impulsivity:  None   Oppositional/Defiant Behaviors:  None   Emotional Irregularity:  None   Other Mood/Personality Symptoms:  Family  history-n/a Dad was a Education officer, environmental and grandmother a Education officer, environmental. Grandmother father and mom opened a church in [place]  after graduated where she lived, went to [employer] four brothers born in [place] . Here for nine years. Stressors-cont-it was Non-Denominational    Mental Status Exam Appearance and self-care  Stature:  Average   Weight:  Overweight   Clothing:  Casual   Grooming:  Normal   Cosmetic use:  None   Posture/gait:  Normal   Motor activity:  Not Remarkable   Sensorium  Attention:  Normal   Concentration:  Normal   Orientation:  X5   Recall/memory:  Normal   Affect and Mood  Affect:  Appropriate   Mood:  Euthymic; Anxious; Irritable   Relating  Eye contact:  Normal   Facial expression:  Responsive   Attitude toward examiner:  Cooperative   Thought and Language  Speech flow: Normal   Thought content:  Appropriate to Mood and Circumstances   Preoccupation:  None   Hallucinations:  None   Organization:  goal directed  [employer] of Knowledge:  Average   Intelligence:  Average   Abstraction:  Normal   Judgement:  Normal   Reality Testing:  Realistic   Insight:  Fair   Decision Making:  Normal   Social Functioning  Social Maturity:  Isolates (doesn't go out. 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 Contact with them once in awhile visit but miss that part. Don't want to search other-)   Coping Ability:  Normal   Skill Deficits:  -- (doesn't relax, plans to go to the [facility].)   Supports:  Family (daughter and oldest son. Lives with daughter her husband two grandkids M.J 3, [name]-7)     Religion: Religion/Spirituality Are You A Religious Person?: Yes What is Your Religious  Affiliation?: Non-Denominational  Leisure/Recreation: Leisure / Recreation Do You Have Hobbies?: Yes Leisure and Hobbies: see above  Exercise/Diet: Exercise/Diet Do You Exercise?: Yes What Type of Exercise Do You Do?: Other (Comment) (simple exercises, walk around the house doing exercises at [facility] now.) How Many Times a Week Do You Exercise?: 1-3 times a week Have You Gained or Lost A Significant Amount of Weight in the Past Six Months?: Yes-Gained Number of Pounds Gained: 30 (lost a lot of weight came out 303 lost 106 lbs with treatment gained 30 back. Partly on oral chemo for next three years part of that is weight gain. has to eliminate that by going to Y.) Do You Follow a Special Diet?: No (point couldn't get anything down point can eat pasta gone from Biscuits and gravy.) Do You Have Any Trouble Sleeping?: Yes Explanation of Sleeping Difficulties: sometimes she falls asleep other times lay there and can't fall asleep until 4-5 AM in morning and just laying there. Not sure what causes  her to lay awake. The other night vivid dream the other night never happened didn't talk to daughter even. Not a suicidal person this was literally committed suicide was asleep can remember every bit wrote a letter to daughter, tape to door be prepared when you walk in outside of envelope went as far as wrapping her in plastic.   CCA Employment/Education Employment/Work Situation:    Education:     CCA Family/Childhood History Family and Relationship History: Family history Marital status: Widowed Widowed, when?: 2016 2nd marriage divorce the kid's dad was horrible man this was her first husband. With him 26 years. Married 10 years to 2nd husband together 12 years. Are you sexually active?: No What is your sexual orientation?: men Has your sexual activity been affected by drugs, alcohol, medication, or emotional stress?: n/a Does patient have children?: Yes How is patient's relationship with their  children?: Jessie-42 in May, [name]-40, Ronald-38 get along great them. [name] is " no bullshit girl" it was hardest think to tell her that couldn't go back to [name] and he put it off on her. What happened with that. looking for house found one in [place] four bedroom, three bathroom under asking price when patient was diagnosed found [facility] where goes for cancer follow up Good relationship with people who come the kids drive her crazy. 7 grandkids and 4 great grandkids  Childhood History:  Childhood History By whom was/is the patient raised?: Both parents Additional childhood history information: childhood was good. Dad was a Education officer, environmental and his mom pastor Description of patient's relationship with caregiver when they were a child: it was rough they did not have much, everything they got was wonderful great relationship with caregivers Patient's description of current relationship with people who raised him/her: mom gone 35 years a rough time. Dad passed he was 73 when passed. How were you disciplined when you got in trouble as a child/adolescent?: n/a Does patient have siblings?: Yes Number of Siblings: 7 Description of patient's current relationship with siblings: 5 brothers and 2 sister patient fifth in line 4 older brothers and then patient younger brother and 2 younger sisters. Doesn't talk much to any of them. One in [place] . One in [place] , one in [place] , oldest brother Eloy Half goes everywhere he goes everything  Child/Adolescent Assessment: n/a     CCA Substance Use Alcohol/Drug Use:                           ASAM's:  Six Dimensions of Multidimensional Assessment  Dimension 1:  Acute Intoxication and/or Withdrawal Potential:      Dimension 2:  Biomedical Conditions and Complications:      Dimension 3:  Emotional, Behavioral, or Cognitive Conditions and Complications:     Dimension 4:  Readiness to Change:     Dimension 5:  Relapse, Continued use, or Continued Problem  Potential:     Dimension 6:  Recovery/Living Environment:     ASAM Severity Score:    ASAM Recommended Level of Treatment:     Substance use Disorder (SUD)    Recommendations for Services/Supports/Treatments: therapy    DSM5 Diagnoses: Patient Active Problem List   Diagnosis Date Noted   Essential tremor 02/26/2023   Memory change 02/26/2023   S/P ablation of atrial fibrillation 02/02/2023   Chronic cough 10/24/2022   Pre-syncope 04/01/2022   De Quervain's tenosynovitis, left 09/06/2021   Constipation 07/05/2020   Breast cancer (HCC) 04/04/2020   Hyperlipidemia associated with  type 2 diabetes mellitus (HCC) 04/04/2020   S/P bilateral mastectomy 03/12/2020   History of COVID-19 03/04/2020   Paroxysmal atrial fibrillation (HCC) 12/12/2019   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  Irritability look at some of the anxiety from aging, not relaxing, irritability with grandchildren, talk about nightmare explore for underlying meaning, coping in general complete assessment starting with childhood history   Referrals to Alternative Service(s): Referred to Alternative Service(s):   Place:   Date:   Time:    Referred to Alternative Service(s):   Place:   Date:   Time:    Referred to Alternative Service(s):   Place:   Date:   Time:    Referred to Alternative Service(s):   Place:   Date:   Time:      Collaboration of Care: Other review of primary care recent note  Patient/Guardian was advised Release of Information must be obtained prior to any record release in order to collaborate their care with an outside provider. Patient/Guardian was advised if they have not already done so to contact the registration department to sign  all necessary forms in order for us  to release information regarding their care.   Consent: Patient/Guardian gives verbal consent for treatment and assignment of benefits for services provided during this visit. Patient/Guardian expressed understanding and agreed to proceed.   Dallie Duel, LCSW

## 2023-05-05 ENCOUNTER — Other Ambulatory Visit: Payer: Self-pay | Admitting: Family Medicine

## 2023-05-07 DIAGNOSIS — Z9889 Other specified postprocedural states: Secondary | ICD-10-CM | POA: Diagnosis not present

## 2023-05-07 DIAGNOSIS — Z79899 Other long term (current) drug therapy: Secondary | ICD-10-CM | POA: Diagnosis not present

## 2023-05-07 DIAGNOSIS — Z7901 Long term (current) use of anticoagulants: Secondary | ICD-10-CM | POA: Diagnosis not present

## 2023-05-07 DIAGNOSIS — Z8679 Personal history of other diseases of the circulatory system: Secondary | ICD-10-CM | POA: Diagnosis not present

## 2023-05-07 DIAGNOSIS — Z5181 Encounter for therapeutic drug level monitoring: Secondary | ICD-10-CM | POA: Diagnosis not present

## 2023-05-07 DIAGNOSIS — I4819 Other persistent atrial fibrillation: Secondary | ICD-10-CM | POA: Diagnosis not present

## 2023-05-09 DIAGNOSIS — I499 Cardiac arrhythmia, unspecified: Secondary | ICD-10-CM | POA: Diagnosis not present

## 2023-05-14 ENCOUNTER — Encounter: Payer: Self-pay | Admitting: Family Medicine

## 2023-05-14 NOTE — Telephone Encounter (Signed)
 Has been sent to [facility]. Gregory Leash, MD/[facility]. Called and confirmed they accept insurance

## 2023-05-20 ENCOUNTER — Other Ambulatory Visit: Payer: Self-pay | Admitting: Family Medicine

## 2023-05-21 ENCOUNTER — Ambulatory Visit (HOSPITAL_COMMUNITY): Admitting: Licensed Clinical Social Worker

## 2023-05-21 ENCOUNTER — Encounter (HOSPITAL_COMMUNITY): Payer: Self-pay

## 2023-05-21 DIAGNOSIS — F411 Generalized anxiety disorder: Secondary | ICD-10-CM

## 2023-05-21 NOTE — Progress Notes (Signed)
 THERAPIST PROGRESS NOTE  Session Time: 3:00 PM to 3:46 PM  Participation Level: Active  Behavioral Response: CasualAlertappropriate  Type of Therapy: Individual Therapy  Treatment Goals addressed: Explore symptoms of anxiety how severe, coping  ProgressTowards Goals: Initial-therapist got a little more context like coming to treatment a lot of it is because of kids exploring more specifically reasons but really seeing anxiety issues seeing other issues coming up such as grandkid misbehaving earlier history with son asking her to leave when diagnosed with cancer as sources but not issues why patient says coming in  Interventions: Solution Focused, Strength-based, Supportive, and Other: coping  Summary: Jasmine Montgomery is a 73 y.o. female who presents with doing fine. Kids think anxiety she doesn't see it or notice it. Sometimes irritated with grandkids on Friday and Saturday no big deal they are normal kids. Son worried about cognitive aspect of brain patient says she is old 75 years get old and forgets things.  Therapist agrees cognitive decline is part of the natural process of aging.  Patient is going to neurologist take a picture of brain. Therapist suggested write things down hard when hands shake.  There was an incident son said told her not to tell daughter patient says doesn't remember him saying that.  Explained the context aunt is coming from [place] lives there coming for graduation.  What she remembers is that was going but on is taking her take it.  Therapist sees this as still not out of the norm.  Reviewed more about her history 6 grand kids 4 great grand kids. Enjoy their time together some don't see some of them often.  Talked about why kids states she has anxiety patient makes the point every one can feel overwhelmed.  Shared instance where happened to daughter and therapist agrees with that as well we will have some anxiety the point is not to a point where gets too  distressing.  Daughter has pounded out to her sometimes look at [facility] like he could die patient says doesn't think every done that. Looked back run home from grandmother to Mom's something she had done had caused her to be mad her look was upset but didn't make you love her anything less. Never felt that way about her grandson. Now works hard thinks about  everything say and and does with grandkids. Daughter does point out when sees her looking like that patient says tells her not thinking or looking at him even. NJ-14 gets to her put her daughter through ringer last couple of years not doing school work and black belt disrespect to her and does throw patient for a loop not allowed to say anything. Dad will speak out not disrespect my wife. He might be diagnosed with ADD he goes one thing to another. [place] not allowed on phone and passcode on computer and can't use if in trouble. Disrespect daughter this year a big issue. If leave inpatient issue is not to ask her to be apparent then therapist agrees needs some authority. The say tell them one time pay for it when they get home patient says then becomes a tattle tale for them. If say anything [place] will tell them and therapist can see where he uses this to manipulate between adults. Very careful with what say and how say and how look at them. Describes feeling of tension in the house. Doesn't remember dealing with this ex-husband strict and not a nice man. Kids treated her with respect. Good relationship  with all three kids.  What patient started this cancer a cancer survivor. When found out had it her daughter researched hospitals found [facility]. So that is where she has gone five years now. When that happened youngest son told daughter have to live with you can't live here. Where thought she had anxiety started. Big time disruption when 2nd husband moved out and living with youngest son reason did that he told her move with us  be part of the family. His way of being  part of family totally different than hers. Run off and say take care of the dogs or feed the horses for a few day not idea of being part of the family. When she found out he didn't want her to stay she said not my first choice of caretaker if up in room lay there two weeks nobody notice nobody check on her. Don't let it be wedge between them. Took time to get over that.  Coincidently daughter were looking for a house only get a 3 bedroom ended up 4 bedroom house. How convenient is that. With son he wouldn't ask her not look in eyes live somewhere else his sister had to tell him. How he is like his dad controlling so sad. Some ways came in with his granddaughter and wife. They lie a lot.  Left that for next session  Explored issues for coming to therapy kids that she has anxiety explored further forgetful therapist noted that's not anxiety is much as cognitive issues.  Patient provided perspective as well getting older you lose some of her cognitive abilities part of aging and therapist agrees she is going to a neurologist to get an MRI.  Therapist suggested writing things down she says hard to do because of shaking.  Things came up daughter saying patient will have a look on her face towards son concerning patient not even thinking about son does not have these negative thoughts notes she is very careful what she she says what she does as a way to counter that.  Therapist encouraged her to have daughter pointed out and she does but again not at all thinking negative things.  Did talk about the challenges grandson disrespectful to daughter.  Noted patient's not allowed to say anything therapist explored how she feels about that she relates only when she is watching the kids wishes she could have more authority and therapist agrees things that would be necessary.  Talked about the source was when her son asked her to go to her daughters did not even tell her had her daughter tell patient.  Therapist noted hurtful  patient agrees therapist noted it seems more like a hand problem than her problem.  Patient did not hold onto it did not want a wedged between them.  Noted starting to talk about her nursing history more history about her son's family will start that at next session.  In terms of these issues really helps that she has a good relationship with her kids.  Suicidal/Homicidal: No  Plan: Return again in 4 weeks.2.  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 Patient/Guardian was advised if they have not already done so  to contact the registration department to sign all necessary forms in order for us  to release information regarding their care.   Consent: Patient/Guardian gives verbal consent for treatment and assignment of benefits for services provided during this visit. Patient/Guardian expressed understanding and agreed to proceed.   Dallie Duel, LCSW 05/21/2023

## 2023-05-29 ENCOUNTER — Other Ambulatory Visit: Payer: Self-pay | Admitting: Family Medicine

## 2023-05-30 ENCOUNTER — Other Ambulatory Visit: Payer: Self-pay | Admitting: Family Medicine

## 2023-05-30 MED ORDER — LOSARTAN POTASSIUM 25 MG PO TABS: 25.0000 mg | ORAL_TABLET | Freq: Every day | ORAL | 0 refills | Status: DC

## 2023-05-30 NOTE — Telephone Encounter (Signed)
 Copied from CRM [id number]. Topic: Clinical - Medication Refill >> May 30, 2023  8:57 AM Lenon Radar A wrote: Medication: losartan  (COZAAR ) 25 MG tablet  Has the patient contacted their pharmacy? Yes (Agent: If no, request that the patient contact the pharmacy for the refill. If patient does not wish to contact the pharmacy document the reason why and proceed with request.) (Agent: If yes, when and what did the pharmacy advise?)  Patients insurance called in to request refill. Patient did not contact pharmacy for refill.   This is the patient's preferred pharmacy:  [facility] [address], [place] - [address] [place] [address] Phone: [phone] Fax: [phone]  Is this the correct pharmacy for this prescription? Yes If no, delete pharmacy and type the correct one.   Has the prescription been filled recently? No  Is the patient out of the medication? Yes  Has the patient been seen for an appointment in the last year OR does the patient have an upcoming appointment? Yes  Can we respond through MyChart? Yes  Agent: Please be advised that Rx refills may take up to 3 business days. We ask that you follow-up with your pharmacy.

## 2023-05-30 NOTE — Telephone Encounter (Signed)
 Last Fill: 12/06/22  Last OV: 02/26/23 Next OV: 08/08/23 AWV  Routing to provider for review/authorization.

## 2023-06-12 ENCOUNTER — Ambulatory Visit (HOSPITAL_COMMUNITY): Admitting: Licensed Clinical Social Worker

## 2023-06-12 DIAGNOSIS — F411 Generalized anxiety disorder: Secondary | ICD-10-CM | POA: Diagnosis not present
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
## 2023-06-14 DIAGNOSIS — R918 Other nonspecific abnormal finding of lung field: Secondary | ICD-10-CM | POA: Diagnosis not present

## 2023-06-14 DIAGNOSIS — F17210 Nicotine dependence, cigarettes, uncomplicated: Secondary | ICD-10-CM | POA: Diagnosis not present

## 2023-06-14 DIAGNOSIS — Z122 Encounter for screening for malignant neoplasm of respiratory organs: Secondary | ICD-10-CM | POA: Diagnosis not present

## 2023-06-16 ENCOUNTER — Other Ambulatory Visit: Payer: Self-pay | Admitting: Family Medicine

## 2023-06-22 ENCOUNTER — Other Ambulatory Visit: Payer: Self-pay | Admitting: Family Medicine

## 2023-06-22 DIAGNOSIS — E119 Type 2 diabetes mellitus without complications: Secondary | ICD-10-CM

## 2023-06-25 ENCOUNTER — Other Ambulatory Visit: Payer: Self-pay

## 2023-06-25 DIAGNOSIS — E119 Type 2 diabetes mellitus without complications: Secondary | ICD-10-CM

## 2023-06-25 MED ORDER — RYBELSUS 7 MG PO TABS: 1.0000 | ORAL_TABLET | Freq: Every day | ORAL | 1 refills | Status: DC

## 2023-06-26 ENCOUNTER — Ambulatory Visit (HOSPITAL_COMMUNITY): Admitting: Licensed Clinical Social Worker

## 2023-06-26 DIAGNOSIS — F411 Generalized anxiety disorder: Secondary | ICD-10-CM | POA: Diagnosis not present

## 2023-06-26 NOTE — Progress Notes (Signed)
 THERAPIST PROGRESS NOTE  Session Time: 1:00 PM to 1:48 PM  Participation Level: Active  Behavioral Response: CasualAlertappropriate describes breaking down yesterday able to get to the source and look at ways to cope  Type of Therapy: Individual Therapy  Treatment Goals addressed: Explore symptoms of anxiety how severe, coping  ProgressTowards Goals: Progressing-looked at breaking down yesterday getting to the source looking at ways to cope realizing reasonable to ask for help at something patient has to work on now that she is older and needs more help  Interventions: Solution Focused, Strength-based, Supportive, and Reframing  Summary: Adna Nofziger is a 73 y.o. female who presents with care about diabetic feet. Broke down yesterday feeling overwhelmed work on quilts not able to do it. A new dog a rescue with some pitt. 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 Year had to put her little dog down. Do painting frustrating hands shaking. Don't see close. Always been mellow. Thinks going to find river and read for hours. Talk walks and read. Hard time getting to things doesn't want to ask have a thing about that. Mirror needs hung on wall and son-in-law can't get to it. Shelves so can get some of the stuff put up and out of hair. All on sewing table, fallen apart with dog crete can't get to clothes in closet. Attic rocking chair hard to move around. Hate the containers. Feeling getting older. Older son help. Been busy besides the puppy life slow down kids school make a difference. Pull everything out of room vacuum and dust it can't do it. Get rid of curtains and get blinds. She is used to seeing strong. Grandson can help getting in helping him. Ask for help give a day to get all done. Lived three years hasn't had a good cleaning.  Rebellious teenager [place].   Explored causes for patient having a breakdown yesterday identifying patient getting older not being able to do the things that she  used to do hand shaking harder to do quilting even using her left hand to thread needle and sewing machine therapist impressed though patient has learned these different types of hobbies plans to do that herself and talked about what a legacy that leaves as well as satisfying to do.  Episodes such as her dog has a new puppy that is good for mental health did want to get back in crate and ended up knocking over a rocking chair and home was falling needs to do something so that will not keep happening.  Patient is a person does not like to ask for help but cannot do these things on her own.  Work with patient on reasonableness of asking for help from older son and daughter understandable she gets older she needs help actually as good coping does for help.  It is actually something her children's should want to happen for her place to be clean for things to be put away.  Processed this with patient and she acknowledges she will ask for help realizing at this point she needs to do to help her take care of things like getting older it is reasonable to make this request and people love her would would want to help her in accomplishing these goals such as cleaning her room putting her things away.  Talked in session someone stressor of grand child who who patient sees as a rebellious teenager can be frustrating to see how he treats his parents how he does not do the chores  he is asked to do without a lot of excuses.  Therapist in general provided support and space for patient to talk about thoughts and feelings in session. Suicidal/Homicidal: No  Plan: Return again in 2 weeks.2.  On processing thoughts and feelings related to current stressors explore helpful coping strategies  Diagnosis: Anxiety state  Collaboration of Care: Other none needed  Patient/Guardian was advised Release of Information must be obtained prior to any record release in order to collaborate their care with an outside provider.  Patient/Guardian was advised if they have not already done so to contact the registration department to sign all necessary forms in order for us  to release information regarding their care.   Consent: Patient/Guardian gives verbal consent for treatment and assignment of benefits for services provided during this visit. Patient/Guardian expressed understanding and agreed to proceed.   Dallie Duel, LCSW 06/26/2023

## 2023-07-05 ENCOUNTER — Other Ambulatory Visit: Payer: Self-pay | Admitting: Family Medicine

## 2023-07-05 ENCOUNTER — Ambulatory Visit (INDEPENDENT_AMBULATORY_CARE_PROVIDER_SITE_OTHER): Admitting: Family Medicine

## 2023-07-05 ENCOUNTER — Encounter: Payer: Self-pay | Admitting: Family Medicine

## 2023-07-05 VITALS — BP 124/75 | HR 81 | Ht 65.0 in | Wt 231.0 lb

## 2023-07-05 DIAGNOSIS — E119 Type 2 diabetes mellitus without complications: Secondary | ICD-10-CM | POA: Diagnosis not present

## 2023-07-05 DIAGNOSIS — F411 Generalized anxiety disorder: Secondary | ICD-10-CM | POA: Diagnosis not present

## 2023-07-05 DIAGNOSIS — Z7984 Long term (current) use of oral hypoglycemic drugs: Secondary | ICD-10-CM | POA: Diagnosis not present

## 2023-07-05 DIAGNOSIS — G250 Essential tremor: Secondary | ICD-10-CM | POA: Diagnosis not present

## 2023-07-05 DIAGNOSIS — R4189 Other symptoms and signs involving cognitive functions and awareness: Secondary | ICD-10-CM

## 2023-07-05 DIAGNOSIS — I480 Paroxysmal atrial fibrillation: Secondary | ICD-10-CM | POA: Diagnosis not present

## 2023-07-05 DIAGNOSIS — R413 Other amnesia: Secondary | ICD-10-CM

## 2023-07-05 MED ORDER — SEMAGLUTIDE (1 MG/DOSE) 4 MG/3ML ~~LOC~~ SOPN: 1.0000 mg | PEN_INJECTOR | SUBCUTANEOUS | 3 refills | Status: DC

## 2023-07-05 MED ORDER — PRIMIDONE 125 MG PO TABS: ORAL_TABLET | ORAL | 1 refills | Status: AC

## 2023-07-05 NOTE — Progress Notes (Signed)
 Jasmine Montgomery - 74 y.o. female MRN [id number]  Date of birth: 18-Nov-1950  Subjective Chief Complaint  Patient presents with   Tremors    HPI Jasmine Montgomery is a 73 year old female here today for follow-up visit.  Reports that she was has not felt well recently.  She reports increased anxiety, mainly related to her tremor.  She was started on primidone  previously but has not noticed a whole lot of improvement in her tremor since adding this.  She continues to have difficulty with doing things she enjoys including painting and writing.  She has started seeing a therapist to help address the anxiety as well.  Family does mention that they have noted some mild cognitive changes as well.  Simply forgetful of things that they tell her at times.  ROS:  A comprehensive ROS was completed and negative except as noted per HPI    No Known Allergies  Past Medical History:  Diagnosis Date   A-fib (HCC)    Cancer (HCC) 08/2019   Diabetes mellitus without complication (HCC)    History of breast cancer    Hyperlipidemia    Hypertension    Stroke [facility])     Past Surgical History:  Procedure Laterality Date   BILATERAL TOTAL MASTECTOMY WITH AXILLARY LYMPH NODE DISSECTION  02/2020   BREAST SURGERY  03/2020   HERNIA REPAIR  2010   SMALL INTESTINE SURGERY  03/2016    Social History   Socioeconomic History   Marital status: Widowed    Spouse name: Not on file   Number of children: 3   Years of education: 14   Highest education level: Associate degree: academic program  Occupational History   Occupation: Retired    Comment: retired Engineer, civil (consulting)  Tobacco Use   Smoking status: Every Day    Current packs/day: 0.50    Average packs/day: 0.5 packs/day for 15.0 years (7.5 ttl pk-yrs)    Types: Cigarettes   Smokeless tobacco: Never  Vaping Use   Vaping status: Never Used  Substance and Sexual Activity   Alcohol use: Not Currently   Drug use: Not Currently    Types: Marijuana   Sexual  activity: Not Currently    Partners: Female, Female    Birth control/protection: Post-menopausal  Other Topics Concern   Not on file  Social History Narrative   Lives with her daughter and her family. She enjoys quilting and painting pillow cases.   Social Drivers of Corporate investment banker Strain: Low Risk  (08/07/2022)   Overall Financial Resource Strain (CARDIA)    Difficulty of Paying Living Expenses: Not hard at all  Food Insecurity: Low Risk  (02/02/2023)   Received from [facility]   Hunger Vital Sign    Within the past 12 months, you worried that your food would run out before you got money to buy more: Never true    Within the past 12 months, the food you bought just didn't last and you didn't have money to get more. : Never true  Transportation Needs: No Transportation Needs (02/02/2023)   Received from [facility]    In the past 12 months, has lack of reliable transportation kept you from medical appointments, meetings, work or from getting things needed for daily living? : No  Physical Activity: Inactive (08/07/2022)   Exercise Vital Sign    Days of Exercise per Week: 0 days    Minutes of Exercise per Session: 0 min  Stress: No Stress Concern Present (  08/07/2022)   [employer] [employer] - Occupational Stress Questionnaire    Feeling of Stress : Not at all  Social Connections: Unknown (10/31/2022)   [facility] from [facility]   Social Network    Social Network: Not on file  Recent Concern: Social Connections - Moderately Isolated (08/07/2022)   Social Connection and Isolation Panel    Frequency of Communication with Friends and Family: Never    Frequency of Social Gatherings with Friends and Family: More than three times a week    Attends Religious Services: More than 4 times per year    Active Member of [employer] or Organizations: No    Attends Banker Meetings: Never    Marital Status: Widowed    Family History   Problem Relation Age of Onset   Breast cancer Sister    Cancer Sister    Diabetes Brother    Hypertension Mother    Miscarriages / Stillbirths Mother    Varicose Veins Mother    Cancer Paternal Grandmother    Obesity Paternal Grandmother     Health Maintenance  Topic Date Due   Zoster Vaccines- Shingrix  (1 of 2) Never done   Colonoscopy  Never done   OPHTHALMOLOGY EXAM  10/22/2022   HEMOGLOBIN A1C  05/21/2023   Medicare Annual Wellness (AWV)  08/07/2023   Hepatitis C Screening  08/07/2023 (Originally 04/06/1968)   COVID-19 Vaccine (5 - 2024-25 season) 03/13/2024 (Originally 09/10/2022)   INFLUENZA VACCINE  08/10/2023   Diabetic kidney evaluation - Urine ACR  08/11/2023   FOOT EXAM  09/12/2023   Diabetic kidney evaluation - eGFR measurement  12/06/2023   DTaP/Tdap/Td (3 - Td or Tdap) 09/30/2025   DEXA SCAN  01/18/2032   Pneumococcal Vaccine: 50+ Years  Completed   Hepatitis B Vaccines  Aged Out   HPV VACCINES  Aged Out   Meningococcal B Vaccine  Aged Out     ----------------------------------------------------------------------------------------------------------------------------------------------------------------------------------------------------------------- Physical Exam BP 124/75   Pulse 81   Ht 5' 5 (1.651 m)   Wt 231 lb (104.8 kg)   SpO2 97%   BMI 38.44 kg/m   Physical Exam Constitutional:      Appearance: Normal appearance.   Cardiovascular:     Rate and Rhythm: Normal rate and regular rhythm.  Pulmonary:     Effort: Pulmonary effort is normal.     Breath sounds: Normal breath sounds.   Musculoskeletal:     Cervical back: Neck supple.   Neurological:     Mental Status: She is alert.     Comments: Action tremor noted  Psychiatric:        Mood and Affect: Mood normal.        Behavior: Behavior normal.      ------------------------------------------------------------------------------------------------------------------------------------------------------------------------------------------------------------------- Assessment and Plan  Essential tremor Increasing primidone  to 125 mg.  Can increase further over the next few weeks to max of 250 mg.  We can consider referral to neurology if this continues and not improving.  Memory change Orders Placed This Encounter  Procedures   CMP14+EGFR   CBC with Differential/Platelet   HgB A1c   RPR   B12   TSH + free T4     Type 2 diabetes mellitus without complication, without long-term current use of insulin (HCC) Blood sugars are better controlled.  Continue metformin  with Rybelsus .  Encouraged continued dietary changes.    GAD (generalized anxiety disorder) She is seeing a therapist.  She feels like getting her tremor under better control will help with  this.  Meds ordered this encounter  Medications   primidone  125 MG TABS    Sig: TAKE 1 TABLET BY MOUTH AT BEDTIME. MAY INCREASE BY 1/2 TABLET EACH WEEK TO MAX DAILY DOSE OF 250mg     Dispense:  90 tablet    Refill:  1   Semaglutide , 1 MG/DOSE, 4 MG/3ML SOPN    Sig: Inject 1 mg into the skin once a week.    Dispense:  3 mL    Refill:  3    No follow-ups on file.

## 2023-07-05 NOTE — Assessment & Plan Note (Signed)
Blood sugars are better controlled.  Continue metformin with Rybelsus.  Encouraged continued dietary changes.

## 2023-07-05 NOTE — Assessment & Plan Note (Signed)
 Increasing primidone  to 125 mg.  Can increase further over the next few weeks to max of 250 mg.  We can consider referral to neurology if this continues and not improving.

## 2023-07-05 NOTE — Assessment & Plan Note (Signed)
 She is seeing a therapist.  She feels like getting her tremor under better control will help with this.

## 2023-07-05 NOTE — Assessment & Plan Note (Signed)
 Orders Placed This Encounter  Procedures   CMP14+EGFR   CBC with Differential/Platelet   HgB A1c   RPR   B12   TSH + free T4

## 2023-07-05 NOTE — Patient Instructions (Signed)
 Primidone  changed to 125mg  tablet.  You can increase by 1/2 tablet each week to a max of 250mg .   Let me know if this still doesn't help.

## 2023-07-06 ENCOUNTER — Other Ambulatory Visit: Payer: Self-pay | Admitting: Family Medicine

## 2023-07-06 DIAGNOSIS — R251 Tremor, unspecified: Secondary | ICD-10-CM | POA: Diagnosis not present

## 2023-07-06 DIAGNOSIS — R413 Other amnesia: Secondary | ICD-10-CM | POA: Diagnosis not present

## 2023-07-06 LAB — CBC WITH DIFFERENTIAL/PLATELET
Basophils Absolute: 0.1 10*3/uL (ref 0.0–0.2)
Basos: 1 %
EOS (ABSOLUTE): 0.1 10*3/uL (ref 0.0–0.4)
Eos: 3 %
Hematocrit: 43.5 % (ref 34.0–46.6)
Hemoglobin: 13.9 g/dL (ref 11.1–15.9)
Immature Grans (Abs): 0 10*3/uL (ref 0.0–0.1)
Immature Granulocytes: 0 %
Lymphocytes Absolute: 1.1 10*3/uL (ref 0.7–3.1)
Lymphs: 20 %
MCH: 30.1 pg (ref 26.6–33.0)
MCHC: 32 g/dL (ref 31.5–35.7)
MCV: 94 fL (ref 79–97)
Monocytes Absolute: 0.3 10*3/uL (ref 0.1–0.9)
Monocytes: 6 %
Neutrophils Absolute: 4 10*3/uL (ref 1.4–7.0)
Neutrophils: 70 %
Platelets: 220 10*3/uL (ref 150–450)
RBC: 4.62 x10E6/uL (ref 3.77–5.28)
RDW: 13.1 % (ref 11.7–15.4)
WBC: 5.6 10*3/uL (ref 3.4–10.8)

## 2023-07-06 LAB — CMP14+EGFR
ALT: 15 IU/L (ref 0–32)
AST: 17 IU/L (ref 0–40)
Albumin: 4.3 g/dL (ref 3.8–4.8)
Alkaline Phosphatase: 80 IU/L (ref 44–121)
BUN/Creatinine Ratio: 14 (ref 12–28)
BUN: 14 mg/dL (ref 8–27)
Bilirubin Total: <0.2 mg/dL (ref 0.0–1.2)
CO2: 21 mmol/L (ref 20–29)
Calcium: 9.2 mg/dL (ref 8.7–10.3)
Chloride: 99 mmol/L (ref 96–106)
Creatinine, Ser: 1 mg/dL (ref 0.57–1.00)
Globulin, Total: 2.2 g/dL (ref 1.5–4.5)
Glucose: 140 mg/dL — ABNORMAL HIGH (ref 70–99)
Potassium: 4.5 mmol/L (ref 3.5–5.2)
Sodium: 139 mmol/L (ref 134–144)
Total Protein: 6.5 g/dL (ref 6.0–8.5)
eGFR: 60 mL/min/{1.73_m2} (ref >59)

## 2023-07-06 LAB — HEMOGLOBIN A1C
Est. average glucose Bld gHb Est-mCnc: 131 mg/dL
Hgb A1c MFr Bld: 6.2 % — ABNORMAL HIGH (ref 4.8–5.6)

## 2023-07-06 LAB — TSH+FREE T4
Free T4: 1.41 ng/dL (ref 0.82–1.77)
TSH: 4.5 u[IU]/mL (ref 0.450–4.500)

## 2023-07-06 LAB — RPR: RPR Ser Ql: NONREACTIVE

## 2023-07-06 LAB — VITAMIN B12: Vitamin B-12: >2000 pg/mL — ABNORMAL HIGH (ref 232–1245)

## 2023-07-10 ENCOUNTER — Ambulatory Visit (HOSPITAL_COMMUNITY): Admitting: Licensed Clinical Social Worker

## 2023-07-10 DIAGNOSIS — F411 Generalized anxiety disorder: Secondary | ICD-10-CM | POA: Diagnosis not present

## 2023-07-10 NOTE — Progress Notes (Signed)
 THERAPIST PROGRESS NOTE  Session Time: 4:00 PM to 4:48 PM  Participation Level: Active  Behavioral Response: CasualAlertEuthymic  Type of Therapy: Individual Therapy  Treatment Goals addressed: Explore symptoms of anxiety how severe, coping  ProgressTowards Goals: Progressing-patient is taking positive steps that help her make progress in therapy goals asking for help which recognizing in context makes sense she would need help, talking about her new puppies plans for room all positive news to share  Interventions: Solution Focused, Strength-based, Supportive, and Other: coping  Summary: Jasmine Montgomery is a 73 y.o. female who presents with sharing about her new puppies Dobby, and Darla. Patient's dog is Dobby. Eloisa is a [place]ann[place] is a mix also a [place] he is as big as going to get at foot and half. They are good therapy. Shereen are stubborn only mean if trained to be mean first saw them cam right up to them and rolled over at their legs. They have two puppies lover each the other is a [place]. Mostly have had Shepards most of her life. The other dog they have black lab and shepard mix just the sweetest thing. Therapist asked that she follow through on what we talked about she said called son to get help wondered why Karleen could not help but therapist knows and patient explained he is so busy her son is happy to help he and Arnaldo coming to help. [place] will help her move things.  Therapist happy to see the positive in the relationship as he can be adolescent and challenging.  Sewing today it was cool cut off part of curtain sew up and have to do the other half. Patient and [name], her daughter. Nice to get sewing machine out pillow case out shares she is made pillowcases for dogs to get attached for example showed [employer] picture with is pillow case clear attached to the pillowcase does that for different dogs, dog get pillow case with their names.  Getting older lost a lot of  friends hate that. [place]ed [place].  Marriage-first controlling. 2nd husband was wonderful. Married 10 years.  Therapist wanted to know more about how the room was progressing turns out [place] helped her clean shells.  When her son comes the plan is for her blinds put up, mirror on wall shelves in closet.  Talked about having to get rid of a lot of things three years ago had to get rid tea cups all depression glass.  Was unable to sell a lot of the things did a garage sale made about 1000 but could have made a few thousand.  Therapist can relate different times not organized and how had to get rid of things that meant a lot inherited from family still not looking forward to more downsizing eventually we will have to do. What put up in room? Stuff from 2nd husband. Remind her of things.  Started quilting when didn't have anything else to do. Therapist noted wants to be like that.  She will share more about the new church therapist will try to remember to show her some of the pictures of her mom's quilts that she treasures letting patient see how much they mean what she is doing is good to mean to her family.  Therapist and patient are enthusiastic about hand work therapist wants to get into quilting and patient brought pictures today to show her work which were wonderful really good at quilting.  She got  her sewing machine out and that she enjoyed and therapist through the aspect that she will start to do some of those things more.  She showed her embroidery a lot of creative outlets that she is good at therapist encouraged her and she is motivated and she is good at it therapist shared from perspective of family being left these things are some of the most meaningful things in the world.  Therapist note create a process mood enhancing just to be engaged in it as well as to see things we create is very for filling and patient understands this.  She went ahead and asked for  help very positive her son is going to come and help her with things in her room additionally very positive to hear her grandson helps her out with things as well.  Therapist noted be nice of a place that she enjoys to be in.  We talked about some I think she will put in it one of the things will be reminders of her husband showed therapist a pitcher has good memories from him talked about things like wearing Sheila Llamas outfits he does have a likeness to his [place] with a white beard going out on their bikes living her puppies putting under his beard nice things to share and will be able to put some of these memories in her room.  Therapist enthusiastic how things are moving forward for patient that are positive.  Noted is well found to church next time she is going to share with therapist about her new church she found when she likes.  Therapist provided space and support for patient to talk about thoughts and feelings in session.  Suicidal/Homicidal: No  Plan: Return again in 4 weeks.2.  Patient show therapist more of handiwork she does her quilts talk about church she is joining talk about stressors in session  Diagnosis: Anxiety state  Collaboration of Care: Other none needed  Patient/Guardian was advised Release of Information must be obtained prior to any record release in order to collaborate their care with an outside provider. Patient/Guardian was advised if they have not already done so to contact the registration department to sign all necessary forms in order for us  to release information regarding their care.   Consent: Patient/Guardian gives verbal consent for treatment and assignment of benefits for services provided during this visit. Patient/Guardian expressed understanding and agreed to proceed.   Ronal Sink, LCSW 07/10/2023

## 2023-07-15 ENCOUNTER — Ambulatory Visit: Payer: Self-pay | Admitting: Family Medicine

## 2023-07-15 ENCOUNTER — Other Ambulatory Visit: Payer: Self-pay | Admitting: Family Medicine

## 2023-07-16 ENCOUNTER — Encounter: Payer: Self-pay | Admitting: Family Medicine

## 2023-07-16 ENCOUNTER — Ambulatory Visit: Payer: Self-pay | Admitting: Family Medicine

## 2023-07-16 ENCOUNTER — Ambulatory Visit

## 2023-07-16 DIAGNOSIS — M7989 Other specified soft tissue disorders: Secondary | ICD-10-CM | POA: Diagnosis not present

## 2023-07-26 ENCOUNTER — Ambulatory Visit (INDEPENDENT_AMBULATORY_CARE_PROVIDER_SITE_OTHER): Admitting: Family Medicine

## 2023-07-26 VITALS — BP 169/79 | HR 76 | Ht 65.0 in | Wt 237.0 lb

## 2023-07-26 DIAGNOSIS — G250 Essential tremor: Secondary | ICD-10-CM | POA: Diagnosis not present

## 2023-07-26 DIAGNOSIS — I10 Essential (primary) hypertension: Secondary | ICD-10-CM | POA: Diagnosis not present

## 2023-07-26 DIAGNOSIS — R609 Edema, unspecified: Secondary | ICD-10-CM | POA: Diagnosis not present

## 2023-07-26 DIAGNOSIS — R600 Localized edema: Secondary | ICD-10-CM

## 2023-07-26 MED ORDER — FUROSEMIDE 20 MG PO TABS: ORAL_TABLET | ORAL | 3 refills | Status: AC

## 2023-07-26 NOTE — Patient Instructions (Signed)
 Take furosemide  20mg  daily x7 days then daily as needed for excess swelling.

## 2023-07-28 LAB — BRAIN NATRIURETIC PEPTIDE: BNP: 89 pg/mL (ref 0.0–100.0)

## 2023-07-29 ENCOUNTER — Encounter: Payer: Self-pay | Admitting: Family Medicine

## 2023-07-29 DIAGNOSIS — R600 Localized edema: Secondary | ICD-10-CM | POA: Insufficient documentation

## 2023-07-29 NOTE — Assessment & Plan Note (Signed)
 Seems to be more venous insufficiency.  Checking BNP today.  Recommend compression stockings and elevation.  Low-sodium diet.  Can try furosemide  as needed as well.

## 2023-07-29 NOTE — Assessment & Plan Note (Signed)
 Blood pressure is elevated today.  Historically has been well-controlled with her medications.  I recommended that she check at home and return in 2 to 3 weeks for blood pressure recheck.

## 2023-07-29 NOTE — Assessment & Plan Note (Signed)
 Tremor has increased and she is not experiencing some at rest.  She does have referral to neurology.

## 2023-07-29 NOTE — Progress Notes (Signed)
 Jasmine Montgomery - 73 y.o. female MRN [id number]  Date of birth: 1950/02/18  Subjective Chief Complaint  Patient presents with   Leg Swelling    HPI Jasmine Montgomery is a 73 year old female here today with complaint of lower extremity swelling.  She has had lower extremity swelling for a few days.  Swelling is worse on the right compared to the left.  I did order a lower extremity venous ultrasound which was negative for DVT.  She denies any pain with swelling.  She has not had any chest pain, shortness of breath, cough or new palpitations.  She is on amiodarone but had TSH levels checked less than a month ago.  Blood pressure is a little elevated today.  Continues to have tremor.  She has had referral to neurology for further evaluation.  Primidone  has not really helped at increased strength.  ROS:  A comprehensive ROS was completed and negative except as noted per HPI  No Known Allergies  Past Medical History:  Diagnosis Date   A-fib (HCC)    Cancer (HCC) 08/2019   Diabetes mellitus without complication (HCC)    History of breast cancer    Hyperlipidemia    Hypertension    Stroke [facility])     Past Surgical History:  Procedure Laterality Date   BILATERAL TOTAL MASTECTOMY WITH AXILLARY LYMPH NODE DISSECTION  02/2020   BREAST SURGERY  03/2020   HERNIA REPAIR  2010   SMALL INTESTINE SURGERY  03/2016    Social History   Socioeconomic History   Marital status: Widowed    Spouse name: Not on file   Number of children: 3   Years of education: 14   Highest education level: Associate degree: academic program  Occupational History   Occupation: Retired    Comment: retired Engineer, civil (consulting)  Tobacco Use   Smoking status: Every Day    Current packs/day: 0.50    Average packs/day: 0.5 packs/day for 15.0 years (7.5 ttl pk-yrs)    Types: Cigarettes   Smokeless tobacco: Never  Vaping Use   Vaping status: Never Used  Substance and Sexual Activity   Alcohol use: Not Currently   Drug use:  Not Currently    Types: Marijuana   Sexual activity: Not Currently    Partners: Female, Female    Birth control/protection: Post-menopausal  Other Topics Concern   Not on file  Social History Narrative   Lives with her daughter and her family. She enjoys quilting and painting pillow cases.   Social Drivers of Corporate investment banker Strain: Low Risk  (08/07/2022)   Overall Financial Resource Strain (CARDIA)    Difficulty of Paying Living Expenses: Not hard at all  Food Insecurity: Low Risk  (02/02/2023)   Received from [facility]   Hunger Vital Sign    Within the past 12 months, you worried that your food would run out before you got money to buy more: Never true    Within the past 12 months, the food you bought just didn't last and you didn't have money to get more. : Never true  Transportation Needs: No Transportation Needs (02/02/2023)   Received from [facility]    In the past 12 months, has lack of reliable transportation kept you from medical appointments, meetings, work or from getting things needed for daily living? : No  Physical Activity: Inactive (08/07/2022)   Exercise Vital Sign    Days of Exercise per Week: 0 days    Minutes of  Exercise per Session: 0 min  Stress: No Stress Concern Present (08/07/2022)   [employer] of Occupational Health - Occupational Stress Questionnaire    Feeling of Stress : Not at all  Social Connections: Unknown (10/31/2022)   Received from [facility]   Social Network    Social Network: Not on file  Recent Concern: Social Connections - Moderately Isolated (08/07/2022)   Social Connection and Isolation Panel    Frequency of Communication with Friends and Family: Never    Frequency of Social Gatherings with Friends and Family: More than three times a week    Attends Religious Services: More than 4 times per year    Active Member of [employer] or Organizations: No    Attends Banker Meetings: Never     Marital Status: Widowed    Family History  Problem Relation Age of Onset   Breast cancer Sister    Cancer Sister    Diabetes Brother    Hypertension Mother    Miscarriages / Stillbirths Mother    Varicose Veins Mother    Cancer Paternal Grandmother    Obesity Paternal Grandmother     Health Maintenance  Topic Date Due   Zoster Vaccines- Shingrix  (1 of 2) Never done   Colonoscopy  Never done   OPHTHALMOLOGY EXAM  10/22/2022   Medicare Annual Wellness (AWV)  08/07/2023   Diabetic kidney evaluation - Urine ACR  08/11/2023   Hepatitis C Screening  08/07/2023 (Originally 04/06/1968)   COVID-19 Vaccine (5 - 2024-25 season) 03/13/2024 (Originally 09/10/2022)   INFLUENZA VACCINE  08/10/2023   FOOT EXAM  09/12/2023   HEMOGLOBIN A1C  01/04/2024   Diabetic kidney evaluation - eGFR measurement  07/04/2024   DTaP/Tdap/Td (3 - Td or Tdap) 09/30/2025   DEXA SCAN  01/18/2032   Pneumococcal Vaccine: 50+ Years  Completed   Hepatitis B Vaccines  Aged Out   HPV VACCINES  Aged Out   Meningococcal B Vaccine  Aged Out     ----------------------------------------------------------------------------------------------------------------------------------------------------------------------------------------------------------------- Physical Exam BP (!) 169/79 (BP Location: Left Arm, Patient Position: Sitting, Cuff Size: Large)   Pulse 76   Ht 5' 5 (1.651 m)   Wt 237 lb (107.5 kg)   SpO2 96%   BMI 39.44 kg/m   Physical Exam Constitutional:      Appearance: Normal appearance.  Eyes:     General: No scleral icterus. Cardiovascular:     Rate and Rhythm: Normal rate and regular rhythm.  Pulmonary:     Effort: Pulmonary effort is normal.     Breath sounds: Normal breath sounds.  Neurological:     Mental Status: She is alert.  Psychiatric:        Mood and Affect: Mood normal.        Behavior: Behavior normal.      ------------------------------------------------------------------------------------------------------------------------------------------------------------------------------------------------------------------- Assessment and Plan  Essential hypertension Blood pressure is elevated today.  Historically has been well-controlled with her medications.  I recommended that she check at home and return in 2 to 3 weeks for blood pressure recheck.  Essential tremor Tremor has increased and she is not experiencing some at rest.  She does have referral to neurology.  Lower extremity edema Seems to be more venous insufficiency.  Checking BNP today.  Recommend compression stockings and elevation.  Low-sodium diet.  Can try furosemide  as needed as well.   Meds ordered this encounter  Medications   furosemide  (LASIX ) 20 MG tablet    Sig: Take 20mg  daily x7 days.  Then daily as  needed for excess swelling.    Dispense:  30 tablet    Refill:  3    No follow-ups on file.

## 2023-08-03 ENCOUNTER — Ambulatory Visit: Admitting: Family Medicine

## 2023-08-03 ENCOUNTER — Ambulatory Visit: Payer: Self-pay | Admitting: Family Medicine

## 2023-08-04 ENCOUNTER — Other Ambulatory Visit: Payer: Self-pay | Admitting: Family Medicine

## 2023-08-05 ENCOUNTER — Other Ambulatory Visit: Payer: Self-pay | Admitting: Medical Genetics

## 2023-08-08 ENCOUNTER — Ambulatory Visit (INDEPENDENT_AMBULATORY_CARE_PROVIDER_SITE_OTHER): Payer: Medicare Other

## 2023-08-08 VITALS — BP 152/68 | HR 77 | Ht 64.0 in | Wt 235.0 lb

## 2023-08-08 DIAGNOSIS — Z0000 Encounter for general adult medical examination without abnormal findings: Secondary | ICD-10-CM | POA: Diagnosis not present

## 2023-08-08 NOTE — Progress Notes (Signed)
 Subjective:   Jasmine Montgomery is a 73 y.o. female who presents for Medicare Annual (Subsequent) preventive examination.  Visit Complete: In person  Patient Medicare AWV Montgomery was completed by the patient on n/a; I have confirmed that all information answered by patient is correct and no changes since this date.  Cardiac Risk Factors include: advanced age (>49men, >46 women);obesity (BMI >30kg/m2);smoking/ tobacco exposure;diabetes mellitus;hypertension;dyslipidemia;family history of premature cardiovascular disease     Objective:    Today's Vitals   08/08/23 0907 08/08/23 0944  BP: (!) 181/69 (!) 152/68  Pulse: 77   SpO2: 97%   Weight: 235 lb (106.6 kg)   Height: 5' 4 (1.626 m)    Body mass index is 40.34 kg/m.     08/08/2023    9:23 AM 08/07/2022    9:16 AM 08/02/2021    9:11 AM 04/02/2020    2:09 PM  Advanced Directives  Does Patient Have a Medical Advance Directive? No No No No  Would patient like information on creating a medical advance directive? No - Patient declined No - Patient declined Yes (ED - Information included in AVS) No - Patient declined    Current Medications (verified) Outpatient Encounter Medications as of 08/08/2023  Medication Sig   amiodarone (PACERONE) 200 MG tablet Take 200 mg by mouth daily.   anastrozole (ARIMIDEX) 1 MG tablet Take 1 mg by mouth daily.   Ascorbic Acid (VITAMIN C PO) Take by mouth.   Cholecalciferol 25 MCG (1000 UT) tablet Take by mouth.   clotrimazole -betamethasone  (LOTRISONE ) cream APPLY CREAM TOPICALLY TWICE DAILY   ELIQUIS  5 MG TABS tablet Take 1 tablet by mouth twice daily   furosemide  (LASIX ) 20 MG tablet Take 20mg  daily x7 days.  Then daily as needed for excess swelling. (Patient taking differently: Take 20 mg by mouth. Take 20mg  daily x3 days.  Then daily as needed for excess swelling.)   ketoconazole (NIZORAL) 2 % cream Apply 1 Application topically daily.   losartan  (COZAAR ) 25 MG tablet Take 1 tablet (25 mg  total) by mouth daily.   metFORMIN  (GLUCOPHAGE ) 1000 MG tablet TAKE 1 TABLET BY MOUTH TWICE DAILY WITH A MEAL   metoprolol  succinate (TOPROL -XL) 25 MG 24 hr tablet Take 1 tablet (25 mg total) by mouth daily. Take with or immediately following a meal.   ondansetron  (ZOFRAN ) 4 MG tablet Take 4 mg by mouth every 8 (eight) hours as needed.   pantoprazole  (PROTONIX ) 40 MG tablet Take 1 tablet by mouth once daily   primidone  125 MG TABS TAKE 1 TABLET BY MOUTH AT BEDTIME. MAY INCREASE BY 1/2 TABLET EACH WEEK TO MAX DAILY DOSE OF 250mg    simvastatin  (ZOCOR ) 20 MG tablet TAKE 1 TABLET BY MOUTH ONCE DAILY AT  6PM   VENTOLIN  HFA 108 (90 Base) MCG/ACT inhaler INHALE 1 TO 2 PUFFS BY MOUTH EVERY 4 HOURS AS NEEDED FOR WHEEZING AND FOR SHORTNESS OF BREATH   VITAMIN A PO Take by mouth.   vitamin B-12 (CYANOCOBALAMIN ) 500 MCG tablet Take by mouth.   VITAMIN E PO Take by mouth.   Semaglutide , 1 MG/DOSE, 4 MG/3ML SOPN Inject 1 mg into the skin once a week. (Patient not taking: Reported on 08/08/2023)   No facility-administered encounter medications on file as of 08/08/2023.    Allergies (verified) Patient has no known allergies.   History: Past Medical History:  Diagnosis Date   A-fib (HCC)    Cancer (HCC) 08/2019   Diabetes mellitus without complication (HCC)  History of breast cancer    Hyperlipidemia    Hypertension    Stroke [facility])    Past Surgical History:  Procedure Laterality Date   BILATERAL TOTAL MASTECTOMY WITH AXILLARY LYMPH NODE DISSECTION  02/2020   BREAST SURGERY  03/2020   HERNIA REPAIR  2010   SMALL INTESTINE SURGERY  03/2016   Family History  Problem Relation Age of Onset   Breast cancer Sister    Cancer Sister    Diabetes Brother    Hypertension Mother    Miscarriages / [place] Mother    Varicose Veins Mother    Cancer Paternal Grandmother    Obesity Paternal Grandmother    Social History   Socioeconomic History   Marital status: Widowed    Spouse name: Not on  file   Number of children: 3   Years of education: 14   Highest education level: Associate degree: academic program  Occupational History   Occupation: Retired    Comment: retired Engineer, civil (consulting)  Tobacco Use   Smoking status: Former    Current packs/day: 0.50    Average packs/day: 0.5 packs/day for 46.1 years (23.0 ttl pk-yrs)    Types: Cigarettes    Start date: 09/13/1962    Quit date: 06/16/1982   Smokeless tobacco: Never  Vaping Use   Vaping status: Never Used  Substance and Sexual Activity   Alcohol use: Not Currently   Drug use: Not Currently    Types: Marijuana   Sexual activity: Not Currently    Partners: Female, Female    Birth control/protection: Post-menopausal  Other Topics Concern   Not on file  Social History Narrative   Lives with her daughter and her family. She enjoys quilting and painting pillow cases.   Social Drivers of Corporate investment banker Strain: Low Risk  (08/08/2023)   Overall Financial Resource Strain (CARDIA)    Difficulty of Paying Living Expenses: Not hard at all  Food Insecurity: No Food Insecurity (08/08/2023)   Hunger Vital Sign    Worried About Running Out of Food in the Last Year: Never true    Ran Out of Food in the Last Year: Never true  Transportation Needs: No Transportation Needs (08/08/2023)   PRAPARE - Administrator, Civil Service (Medical): No    Lack of Transportation (Non-Medical): No  Physical Activity: Sufficiently Active (08/08/2023)   Exercise Vital Sign    Days of Exercise per Week: 4 days    Minutes of Exercise per Session: 40 min  Stress: No Stress Concern Present (08/08/2023)   Jasmine Montgomery    Feeling of Stress: Not at all  Social Connections: Moderately Integrated (08/08/2023)   Social Connection and Isolation Panel    Frequency of Communication with Friends and Family: More than three times a week    Frequency of Social Gatherings with Friends and  Family: More than three times a week    Attends Religious Services: More than 4 times per year    Active Member of [employer] or Organizations: Yes    Attends Banker Meetings: More than 4 times per year    Marital Status: Widowed    Tobacco Counseling Counseling given: Not Answered   Clinical Intake:  Pre-visit preparation completed: Yes  Pain : No/denies pain     BMI - recorded: 40.34 Nutritional Status: BMI > 30  Obese Nutritional Risks: None Diabetes: Yes CBG done?: No Did pt. bring in CBG monitor from home?:  No  How often do you need to have someone help you when you read instructions, pamphlets, or other written materials from your doctor or pharmacy?: 1 - Never What is the last grade level you completed in school?: 14  Interpreter Needed?: No      Activities of Daily Living    08/08/2023    9:10 AM  In your present state of health, do you have any difficulty performing the following activities:  Hearing? 0  Vision? 0  Difficulty concentrating or making decisions? 0  Walking or climbing stairs? 0  Dressing or bathing? 0  Doing errands, shopping? 0  Preparing Food and eating ? N  Using the Toilet? N  In the past six months, have you accidently leaked urine? Y  Do you have problems with loss of bowel control? N  Managing your Medications? N  Managing your Finances? N    Patient Care Team: Alvia Bring, DO as PCP - General (Family Medicine) Alexa Chiquita KANDICE DEVONNA (Cardiology) Vicenta Damien Remington, MD as Referring Physician (Hematology and Oncology) Jackalyn Debby Faden, OD (Optometry)  Indicate any recent Medical Services you may have received from other than [facility] providers in the past year (date may be approximate).     Assessment:   This is a routine wellness examination for Jasmine Montgomery.  Hearing/Vision screen No results found.   Goals Addressed             This Visit's Progress    Patient Stated       Patient states she would  like to lose weight.        Depression Screen    08/08/2023    9:21 AM 05/21/2023    3:41 PM 08/11/2022   11:37 AM 08/07/2022    9:16 AM 05/08/2022   11:13 AM 02/07/2022   10:57 AM 11/07/2021   10:17 AM  PHQ 2/9 Scores  PHQ - 2 Score 0  0 0 0 0 0     Information is confidential and restricted. Go to Review Flowsheets to unlock data.    Fall Risk    08/08/2023    9:24 AM 08/11/2022   11:37 AM 08/07/2022    9:09 AM 05/08/2022   11:12 AM 02/07/2022   10:57 AM  Fall Risk   Falls in the past year? 0 0 1 0 0  Number falls in past yr: 0 0 0 0 0  Injury with Fall? 0 0 0 0 0  Risk for fall due to : No Fall Risks No Fall Risks History of fall(s) No Fall Risks No Fall Risks  Follow up Falls evaluation completed Falls evaluation completed Falls evaluation completed;Education provided;Falls prevention discussed Falls evaluation completed Falls evaluation completed    MEDICARE RISK AT HOME: Medicare Risk at Home Any stairs in or around the home?: Yes If so, are there any without handrails?: Yes Home free of loose throw rugs in walkways, pet beds, electrical cords, etc?: Yes Adequate lighting in your home to reduce risk of falls?: Yes Life alert?: No Use of a cane, walker or w/c?: No Grab bars in the bathroom?: No Shower chair or bench in shower?: No Elevated toilet seat or a handicapped toilet?: No  TIMED UP AND GO:  Was the test performed?  Yes  Length of time to ambulate 10 feet: 9 sec Gait steady and fast without use of assistive device    Cognitive Function:        08/08/2023    9:27 AM 08/07/2022  9:30 AM 08/02/2021    9:28 AM  6CIT Screen  What Year? 0 points 0 points 0 points  What month? 0 points 0 points 0 points  What time? 0 points 0 points 0 points  Count back from 20 0 points 0 points 0 points  Months in reverse 2 points 0 points 0 points  Repeat phrase 2 points 0 points 2 points  Total Score 4 points 0 points 2 points    Immunizations Immunization History   Administered Date(s) Administered   PFIZER Comirnaty(Gray Top)Covid-19 Tri-Sucrose Vaccine 09/18/2019, 10/08/2019   PFIZER(Purple Top)SARS-COV-2 Vaccination 09/18/2019, 10/29/2019   PNEUMOCOCCAL CONJUGATE-20 08/29/2021   Pneumococcal-Unspecified 08/29/2021   Td 10/01/2015   Tdap 10/01/2015    TDAP status: Up to date  Flu Vaccine status: Declined, Education has been provided regarding the importance of this vaccine but patient still declined. Advised may receive this vaccine at local pharmacy or Health Dept. Aware to provide a copy of the vaccination record if obtained from local pharmacy or Health Dept. Verbalized acceptance and understanding.  Pneumococcal vaccine status: Up to date  Covid-19 vaccine status: Completed vaccines  Qualifies for Shingles Vaccine? Yes   Zostavax completed No   Shingrix  Completed?: No.    Education has been provided regarding the importance of this vaccine. Patient has been advised to call insurance company to determine out of pocket expense if they have not yet received this vaccine. Advised may also receive vaccine at local pharmacy or Health Dept. Verbalized acceptance and understanding.  Screening Tests Health Maintenance  Topic Date Due   Hepatitis C Screening  Never done   Zoster Vaccines- Shingrix  (1 of 2) Never done   Colonoscopy  Never done   OPHTHALMOLOGY EXAM  10/22/2022   Diabetic kidney evaluation - Urine ACR  08/11/2023   COVID-19 Vaccine (5 - 2024-25 season) 03/13/2024 (Originally 09/10/2022)   INFLUENZA VACCINE  08/10/2023   FOOT EXAM  09/12/2023   HEMOGLOBIN A1C  01/04/2024   Diabetic kidney evaluation - eGFR measurement  07/04/2024   Medicare Annual Wellness (AWV)  08/07/2024   DTaP/Tdap/Td (3 - Td or Tdap) 09/30/2025   DEXA SCAN  01/18/2032   Pneumococcal Vaccine: 50+ Years  Completed   Hepatitis B Vaccines  Aged Out   HPV VACCINES  Aged Out   Meningococcal B Vaccine  Aged Out    Health Maintenance  Health Maintenance Due   Topic Date Due   Hepatitis C Screening  Never done   Zoster Vaccines- Shingrix  (1 of 2) Never done   Colonoscopy  Never done   OPHTHALMOLOGY EXAM  10/22/2022   Diabetic kidney evaluation - Urine ACR  08/11/2023   Colon Cancer screening - declined  Mammogram status: No longer required due to bilateral mastectomy.  Bone Density status: Completed 01/17/2022. Results reflect: Bone density results: NORMAL. Repeat every 2 years.  Lung Cancer Screening: (Low Dose CT Chest recommended if Age 105-80 years, 20 pack-year currently smoking OR have quit w/in 15years.) does qualify.   Lung Cancer Screening Referral: Already in program with [facility]  Additional Screening:  Hepatitis C Screening: does qualify; Completed   Vision Screening: Recommended annual ophthalmology exams for early detection of glaucoma and other disorders of the eye. Is the patient up to date with their annual eye exam?  Yes  Who is the provider or what is the name of the office in which the patient attends annual eye exams? Dr Jackalyn If pt is not established with a provider, would they like  to be referred to a provider to establish care? N/a.   Dental Screening: Recommended annual dental exams for proper oral hygiene  Diabetic Foot Exam: Diabetic Foot Exam: Completed 09/12/2022  Community Resource Referral / Chronic Care Management: CRR required this visit?  No   CCM required this visit?  No     Plan:     I have personally reviewed and noted the following in the patient's chart:   Medical and social history Use of alcohol, tobacco or illicit drugs  Current medications and supplements including opioid prescriptions. Patient is not currently taking opioid prescriptions. Functional ability and status Nutritional status Physical activity Advanced directives List of other physicians Hospitalizations # 2, surgeries # 1, and ER # 0 visits in previous 12 months Vitals Screenings to include cognitive, depression,  and falls Referrals and appointments  In addition, I have reviewed and discussed with patient certain preventive protocols, quality metrics, and best practice recommendations. A written personalized care plan for preventive services as well as general preventive health recommendations were provided to patient.     Bonny Jon Mayor, CMA   08/08/2023   After Visit Summary: (In Person-Printed) AVS printed and given to the patient  Nurse Notes:   Jasmine Montgomery is a 73 y.o. female patient of Alvia Bring, DO who had a Medicare Annual Wellness Visit today. Jasmine Montgomery is Retired and lives with their family. she has 3 children. She reports that she is socially active and does interact with friends/family regularly. She is moderately physically active and enjoys quilting and painting pillow cases.

## 2023-08-08 NOTE — Patient Instructions (Signed)
  Jasmine Montgomery , Thank you for taking time to come for your Medicare Wellness Visit. I appreciate your ongoing commitment to your health goals. Please review the following plan we discussed and let me know if I can assist you in the future.   These are the goals we discussed:  Goals       Patient Stated (pt-stated)      Would like to get her strength back.       Patient Stated (pt-stated)      Patient stated that she would like to work on her strength.      Patient Stated      Patient states she would like to lose weight.         This is a list of the screening recommended for you and due dates:  Health Maintenance  Topic Date Due   Hepatitis C Screening  Never done   Zoster (Shingles) Vaccine (1 of 2) Never done   Colon Cancer Screening  Never done   Eye exam for diabetics  10/22/2022   Yearly kidney health urinalysis for diabetes  08/11/2023   COVID-19 Vaccine (5 - 2024-25 season) 03/13/2024*   Flu Shot  08/10/2023   Complete foot exam   09/12/2023   Hemoglobin A1C  01/04/2024   Yearly kidney function blood test for diabetes  07/04/2024   Medicare Annual Wellness Visit  08/07/2024   DTaP/Tdap/Td vaccine (3 - Td or Tdap) 09/30/2025   DEXA scan (bone density measurement)  01/18/2032   Pneumococcal Vaccine for age over 70  Completed   Hepatitis B Vaccine  Aged Out   HPV Vaccine  Aged Out   Meningitis B Vaccine  Aged Out  *Topic was postponed. The date shown is not the original due date.

## 2023-08-13 ENCOUNTER — Other Ambulatory Visit: Payer: Self-pay | Admitting: Family Medicine

## 2023-08-13 ENCOUNTER — Ambulatory Visit (INDEPENDENT_AMBULATORY_CARE_PROVIDER_SITE_OTHER): Admitting: Licensed Clinical Social Worker

## 2023-08-13 DIAGNOSIS — F411 Generalized anxiety disorder: Secondary | ICD-10-CM | POA: Diagnosis not present

## 2023-08-13 NOTE — Progress Notes (Signed)
 THERAPIST PROGRESS NOTE  Session Time: 3:00 PM to 3:48 PM  Participation Level: Active  Behavioral Response: CasualAlertappropriate  Type of Therapy: Individual Therapy  Treatment Goals addressed: Explore symptoms of anxiety how severe, coping  ProgressTowards Goals: Progressing-shared positive news of thinking buying a house as well as working on issue of thinking about others before self and ways she has made progress with that  Interventions: Solution Focused, Strength-based, Supportive, and Other: coping  Summary: Jasmine Montgomery is a 73 y.o. female who presents with daughter wants her to bring up today done a crazy thing for 44 mother-in-law for son-in-law are talking about moving out and buying a place together. Her name is Environmental manager. Working on an application to buy a house together. Never bought a home so all new to her. They talked about it. Daughter said if get an apartment patient could get one too. All need is a  house doesn't want an apartment. Daughter is good with that. Talked to older son and daughter. Hadn't talked to younger son who said not a good time to buy a house, interest rate, how afford the payments? Can't let him bring her down. Her feeling was down the road Jasmine Montgomery would ask when did you buy a house didn't want him to fee left him out.  Daughter wanted her to bring up worry about too much about others' feelings not consider herself. Has considered other's feelings in the past before her own.  Noted in this case does not seem to be the same case though something to work on.  Daughter thinks she dragged what happened Ronis with her where wife and daughters like to get her out of the house. Jasmine Montgomery didn't say need to move, he said stay at Jasmine Montgomery's need treatment and then let Jasmine Montgomery know she needed to stay there. He made Allana tell her.  Therapist sees how could she not help to after what happened has more empathy for patient's position. Jasmine Montgomery see things from perspective  girls can do no wrong. Patient let it go Jasmine Montgomery doesn't want to talk to him. He is too much like his Dad.  Therapist wonders if Jasmine Montgomery in part may also be Allan's issues.does feel she considered herself more this time not going to follow his direction did not let his attitude sway which she wants to do.  Doesn't always consider her feelings first.  Patient says hopes older son told younger son he will have to come see her and therapist noted given what happened he needs to make effort to come see her anyway as her child seems he would understand that. Patient says he doesn't put effort into anything but girls.  Does notice instances where he speaks up for herself  with mother-in-law an example.  She can be loud and patient said let me finish my story.   Talked about the plans for the house 3 bedrooms 1 will be crafting room and both do sewing and crafting. Suggested making things and selling at [employer] extra income. Maybe orders. Therapist liked that idea. Waiting to see what need to do for loan. Would be happier in home.  Think has gotten better thinking of herself. Said about Jasmine Montgomery what she figured he would say done.     Allana has own issues with Jasmine Montgomery for years and had enough. Come to house call the cops years didn't know doesn't know and doesn't want to. He doesn't make  much of effort very selfish. Lived with them four years.  Make dinner always complaint. Loved her room had her sewing set up spent hours sewing. Therapist asked if that environment didn't weigh on her patient says impacted her.  Patient said hard to explain what happened and what transpired.   In looking at her time patient has become more involved with that more into crafting working on finishing a quilt and showed pictures of other quotes she has made therapist impressed.    Patient brought to session daughter wanting to bring up things about others before herself we explored further for more context.  Patient told younger  son thinking of buying a house and he did what was expected was negative about it.  Therapist does not see that is considering his feelings she still thinking about buying a house no matter what he said and expected the negativity.  Therapist can see telling him now helps avoid problems in the future where he would feel she did not tell him.  Therapist thinks the fact she is getting a house or thinking about getting house with mother-in-law's is cool do not see that all the time and both would benefit and get along with each other have to look into it further but would give patient more space for herself.  Therapist thinks it is a good idea and see where it goes.  Patient cannot think of others for so we looked at some strategies to work on that This includes practice self compassion gave patient example someone I can care for feeling this way how would I respond then you offer yourself the same care.  Treat yourself with the same kindness and understanding you offer to loved ones therapist explained the self compassion helps 1 to be good to oneself helps with coping.  Balance empathy with self reflection empathy is powerful but it can sometimes lead to emotional exhaustion tried paring empathy with regular check-in's.  Make sure that she is keeping a balance that way suggestions were offered such as journaling or brief mindfulness exercises to help you stay in touch with your own needs and emotions.  Set intentional boundaries thinking of your self means recognizing when to say no or not now without guilt.  Celebrate your wings therapist noted we were doing that when she spoke up with mother-in-law when she very directly said we let me finish the store a therapist that she can say that think that is a sign ever being able to speak up and patient says getting better schedule you time and therapist noted this is where were talking about her doing her hobbies picking them up.  End of session was spent with patient  showing therapist pictures of quilts embroidery has a good idea of selling the things at farmers market and getting orders may help bring in more money for their goal of buying a house Suicidal/Homicidal: No  Plan: Return again in 3 weeks.2.  Work on issues patient brings up in session such as thinking more about herself is much as she thinks about other people other stressors process thoughts and feelings in session  Diagnosis: Anxiety state  Collaboration of Care: Other none needed  Patient/Guardian was advised Release of Information must be obtained prior to any record release in order to collaborate their care with an outside provider. Patient/Guardian was advised if they have not already done so to contact the registration department to sign all necessary forms in order for us  to release information regarding their care.   Consent: Patient/Guardian gives verbal consent for  treatment and assignment of benefits for services provided during this visit. Patient/Guardian expressed understanding and agreed to proceed.   Ronal Sink, LCSW 08/13/2023

## 2023-08-15 ENCOUNTER — Other Ambulatory Visit: Payer: Self-pay | Admitting: Family Medicine

## 2023-08-28 ENCOUNTER — Other Ambulatory Visit: Payer: Self-pay | Admitting: Family Medicine

## 2023-09-06 ENCOUNTER — Encounter: Payer: Self-pay | Admitting: Family Medicine

## 2023-09-06 ENCOUNTER — Ambulatory Visit (INDEPENDENT_AMBULATORY_CARE_PROVIDER_SITE_OTHER): Admitting: Family Medicine

## 2023-09-06 VITALS — BP 162/84 | HR 77 | Ht 64.0 in | Wt 234.0 lb

## 2023-09-06 DIAGNOSIS — Z7985 Long-term (current) use of injectable non-insulin antidiabetic drugs: Secondary | ICD-10-CM

## 2023-09-06 DIAGNOSIS — I10 Essential (primary) hypertension: Secondary | ICD-10-CM

## 2023-09-06 DIAGNOSIS — Z7984 Long term (current) use of oral hypoglycemic drugs: Secondary | ICD-10-CM

## 2023-09-06 DIAGNOSIS — E119 Type 2 diabetes mellitus without complications: Secondary | ICD-10-CM

## 2023-09-06 LAB — POCT UA - MICROALBUMIN
Albumin/Creatinine Ratio, Urine, POC: <30
Creatinine, POC: 200 mg/dL
Microalbumin Ur, POC: 80 mg/L

## 2023-09-06 MED ORDER — LOSARTAN POTASSIUM 50 MG PO TABS: 50.0000 mg | ORAL_TABLET | Freq: Every day | ORAL | 1 refills | Status: DC

## 2023-09-06 NOTE — Progress Notes (Signed)
 Jasmine Montgomery - 73 y.o. female MRN [id number]  Date of birth: 1950/09/15  Subjective Chief Complaint  Patient presents with   Edema   Hypertension    HPI Jasmine Montgomery is a 73 y.o. female here today for follow up visit.   Reports that edema has improved since last visit.  Overall feeling okay at this point.  Blood pressure remains elevated today.  Denies chest pain, shortness of breath, palpitations, headaches.  She is taking losartan  daily as directed.  She also remains on metoprolol  for history of atrial fibrillation.  ROS:  A comprehensive ROS was completed and negative except as noted per HPI  No Known Allergies  Past Medical History:  Diagnosis Date   A-fib (HCC)    Cancer (HCC) 08/2019   Diabetes mellitus without complication (HCC)    History of breast cancer    Hyperlipidemia    Hypertension    Stroke [facility])     Past Surgical History:  Procedure Laterality Date   BILATERAL TOTAL MASTECTOMY WITH AXILLARY LYMPH NODE DISSECTION  02/2020   BREAST SURGERY  03/2020   HERNIA REPAIR  2010   SMALL INTESTINE SURGERY  03/2016    Social History   Socioeconomic History   Marital status: Widowed    Spouse name: Not on file   Number of children: 3   Years of education: 14   Highest education level: Associate degree: academic program  Occupational History   Occupation: Retired    Comment: retired Engineer, civil (consulting)  Tobacco Use   Smoking status: Former    Current packs/day: 0.50    Average packs/day: 0.5 packs/day for 46.1 years (23.1 ttl pk-yrs)    Types: Cigarettes    Start date: 09/13/1962    Quit date: 06/16/1982   Smokeless tobacco: Never  Vaping Use   Vaping status: Never Used  Substance and Sexual Activity   Alcohol use: Not Currently   Drug use: Not Currently    Types: Marijuana   Sexual activity: Not Currently    Partners: Female, Female    Birth control/protection: Post-menopausal  Other Topics Concern   Not on file  Social History Narrative   Lives with her  daughter and her family. She enjoys quilting and painting pillow cases.   Social Drivers of Corporate investment banker Strain: Low Risk  (08/08/2023)   Overall Financial Resource Strain (CARDIA)    Difficulty of Paying Living Expenses: Not hard at all  Food Insecurity: No Food Insecurity (08/08/2023)   Hunger Vital Sign    Worried About Running Out of Food in the Last Year: Never true    Ran Out of Food in the Last Year: Never true  Transportation Needs: No Transportation Needs (08/08/2023)   PRAPARE - Administrator, Civil Service (Medical): No    Lack of Transportation (Non-Medical): No  Physical Activity: Sufficiently Active (08/08/2023)   Exercise Vital Sign    Days of Exercise per Week: 4 days    Minutes of Exercise per Session: 40 min  Stress: No Stress Concern Present (08/08/2023)   [employer] of Occupational Health - Occupational Stress Questionnaire    Feeling of Stress: Not at all  Social Connections: Moderately Integrated (08/08/2023)   Social Connection and Isolation Panel    Frequency of Communication with Friends and Family: More than three times a week    Frequency of Social Gatherings with Friends and Family: More than three times a week    Attends Religious Services: More than 4 times  per year    Active Member of Clubs or Organizations: Yes    Attends Banker Meetings: More than 4 times per year    Marital Status: Widowed    Family History  Problem Relation Age of Onset   Breast cancer Sister    Cancer Sister    Diabetes Brother    Hypertension Mother    Miscarriages / Stillbirths Mother    Varicose Veins Mother    Cancer Paternal Grandmother    Obesity Paternal Grandmother     Health Maintenance  Topic Date Due   Hepatitis C Screening  Never done   Zoster Vaccines- Shingrix  (1 of 2) Never done   Colonoscopy  Never done   OPHTHALMOLOGY EXAM  10/22/2022   INFLUENZA VACCINE  Never done   COVID-19 Vaccine (5 - 2025-26 season)  09/10/2023   FOOT EXAM  09/12/2023   HEMOGLOBIN A1C  01/04/2024   Diabetic kidney evaluation - eGFR measurement  07/04/2024   Medicare Annual Wellness (AWV)  08/07/2024   Diabetic kidney evaluation - Urine ACR  09/05/2024   DTaP/Tdap/Td (3 - Td or Tdap) 09/30/2025   DEXA SCAN  01/18/2032   Pneumococcal Vaccine: 50+ Years  Completed   HPV VACCINES  Aged Out   Meningococcal B Vaccine  Aged Out     ----------------------------------------------------------------------------------------------------------------------------------------------------------------------------------------------------------------- Physical Exam BP (!) 162/84 (BP Location: Left Arm, Patient Position: Sitting, Cuff Size: Large)   Pulse 77   Ht 5' 4 (1.626 m)   Wt 234 lb (106.1 kg)   SpO2 96%   BMI 40.17 kg/m   Physical Exam Constitutional:      Appearance: Normal appearance.  HENT:     Head: Normocephalic and atraumatic.  Cardiovascular:     Rate and Rhythm: Normal rate and regular rhythm.  Neurological:     General: No focal deficit present.     Mental Status: She is alert.  Psychiatric:        Mood and Affect: Mood normal.        Behavior: Behavior normal.     ------------------------------------------------------------------------------------------------------------------------------------------------------------------------------------------------------------------- Assessment and Plan  Essential hypertension Blood pressure has been elevated.  Increasing losartan  to 50 mg daily.  Low-sodium diet encouraged.   Meds ordered this encounter  Medications   losartan  (COZAAR ) 50 MG tablet    Sig: Take 1 tablet (50 mg total) by mouth daily.    Dispense:  90 tablet    Refill:  1    Return in about 2 weeks (around 09/20/2023) for nurse visit for BP check.

## 2023-09-10 ENCOUNTER — Other Ambulatory Visit: Payer: Self-pay | Admitting: Family Medicine

## 2023-09-10 NOTE — Assessment & Plan Note (Signed)
 Blood pressure has been elevated.  Increasing losartan  to 50 mg daily.  Low-sodium diet encouraged.

## 2023-09-11 ENCOUNTER — Ambulatory Visit (INDEPENDENT_AMBULATORY_CARE_PROVIDER_SITE_OTHER): Admitting: Licensed Clinical Social Worker

## 2023-09-11 ENCOUNTER — Encounter: Payer: Self-pay | Admitting: Sports Medicine

## 2023-09-11 DIAGNOSIS — F411 Generalized anxiety disorder: Secondary | ICD-10-CM

## 2023-09-11 NOTE — Progress Notes (Signed)
 THERAPIST PROGRESS NOTE  Session Time: 2:00 PM to 2:46 PM  Participation Level: Active  Behavioral Response: CasualAlertDysphoric and appropriate dysphoric about family reunion  Type of Therapy: Individual Therapy  Treatment Goals addressed: Explore symptoms of anxiety how severe, coping  ProgressTowards Goals: Progressing-patient processed thoughts and feelings around stressors family reunion help for her to have a release helpful for coping with thoughts and feelings was tearful thinks helpful for an emotional release  Interventions: Solution Focused, Strength-based, Supportive, Reframing, and Other: coping  Summary: Jasmine Montgomery is a 73 y.o. female who presents with this has been a heck of week. Family wants her at reunion got her ID. Fly out on 26 flying to [place] .  Getting an ID was an ordeal going to motor vehicle agency.  Patient became tearful talking about this reunion explored why tearful. Her younger sister came twice, first time both sisters flew out together. Older brother came-Jasmine Montgomery. The ones who didn't come go all over the country and world can't make a simple trip here. Has five brothers. Rarely hear from them. Not thrilled with flying. Getting from gate to gate and will have wheelchair that will make it a lot better. Terrible to not be things used to do. 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 Can't do what used to do at slower pace. Can't do crafty things haven't tried in weeks. May start on a baby blanket.  Therapist use reframing to focus on things she can do she enjoys. There are eight of them could be first and last reunion. Sister's daughter married the day before a lot of family there for that. Great nephew married day before her on Saturday and nobody going to be there for him. He is brother's son's, son great nephew all black sheep of the family. Nephew and wife black sheep kind of and son is getting married. What makes them  black sheep? Patient says the way live.  30-50 people at reunion lot of nieces and nephews not there, or cousins don't know why they don't know. Therapist used the word cliquey and patient agreed with the word. 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  Therapist asked how her family is reacting shocked family gathering not invited.Daughter written them off. Albino gay last family gathering 3 brothers talked down because he is gay.  Talked about how he is a Occupational hygienist at an agency advocating for gay rights The house plan house hasn't worked out didn't think it would.  Patients that too much related to income but can look relook at it.  Two of their dogs in a major fight [place] older of dogs 8 years in family, had Doby head in mouth and chewing how bad it got.  It people are on the trampoline Dobbie will be tearing and ripping at it patient has  collar and remote wanted to get Dobby and remote get away.  Mother-in-law was given command and then Jasmine Montgomery, her young granddaughter, jumped in. go.  Patient frustrated they jumped and so her dog would pay attention to her.  When the fight escalated with the 2 dogs. Patient said with Serafina issue always around food slow down around eating but somebody must have starved. Wait to see if happen will said bluntly shut up my dog and I have the control  if hears otherwise doesn't obey. Not a softer way she doesn't do it soft either so way to communicate with her.  Has to get some bill paid off and will be ok. Might look again for housing.   Think about it may not be time can move. Has o think about that. Daughter suggested a craft room.    Patient tearful talking about family reunion no interest in going explained to therapist why some of the siblings have been bothered with her has written them  off.  Therapist can understand that validating this then becomes a trigger when she is invited. Therapy in general helps clients process emotions and gain clarity. It's a safe outlet for reflection and emotional release.  Patient also does not like flying processes that with her mainly getting from [place] to [place] a positive that has wheelchair.  Therapist strategize ways that may help such as going to her room when she needs to plans on it there is a pool patient says that may be too much and therapist agrees and that recognition is helps as well therapist thinks.  Talked about recent stressors in a house with her dog frustrated with mother-in-law interfering with her managing the dog so dog not listening to her escalating the situation.  Talked about how she wants to handle it if it happens again we will speak up in a blunt way because that is the way she handles it.  Although still friendly cannot look for house at this point due to finances but can reconsider.  Therapist pointed out she is happy at her daughters patient said yes talking about a hobby room which therapist thinks would be great idea  Suicidal/Homicidal: No  Plan: Return again in 4-5 weeks.2.  Review family reunion review other stressors support patient and positive coping  Diagnosis: Anxiety state  Collaboration of Care: Other none needed  Patient/Guardian was advised Release of Information must be obtained prior to any record release in order to collaborate their care with an outside provider. Patient/Guardian was advised if they have not already done so to contact the registration department to sign all necessary forms in order for us  to release information regarding their care.   Consent: Patient/Guardian gives verbal consent for treatment and assignment of benefits for services provided during this visit. Patient/Guardian expressed understanding and agreed to proceed.   Ronal Sink, LCSW 09/11/2023

## 2023-09-13 ENCOUNTER — Other Ambulatory Visit: Payer: Self-pay | Admitting: Family Medicine

## 2023-09-17 DIAGNOSIS — Z7901 Long term (current) use of anticoagulants: Secondary | ICD-10-CM | POA: Diagnosis not present

## 2023-09-17 DIAGNOSIS — Z5181 Encounter for therapeutic drug level monitoring: Secondary | ICD-10-CM | POA: Diagnosis not present

## 2023-09-17 DIAGNOSIS — Z79899 Other long term (current) drug therapy: Secondary | ICD-10-CM | POA: Diagnosis not present

## 2023-09-17 DIAGNOSIS — Z9889 Other specified postprocedural states: Secondary | ICD-10-CM | POA: Diagnosis not present

## 2023-09-17 DIAGNOSIS — Z8679 Personal history of other diseases of the circulatory system: Secondary | ICD-10-CM | POA: Diagnosis not present

## 2023-09-17 DIAGNOSIS — I4819 Other persistent atrial fibrillation: Secondary | ICD-10-CM | POA: Diagnosis not present

## 2023-09-18 ENCOUNTER — Other Ambulatory Visit: Payer: Self-pay | Admitting: Family Medicine

## 2023-09-18 DIAGNOSIS — E119 Type 2 diabetes mellitus without complications: Secondary | ICD-10-CM

## 2023-09-20 ENCOUNTER — Ambulatory Visit (INDEPENDENT_AMBULATORY_CARE_PROVIDER_SITE_OTHER)

## 2023-09-20 VITALS — BP 150/58 | HR 80

## 2023-09-20 DIAGNOSIS — I10 Essential (primary) hypertension: Secondary | ICD-10-CM

## 2023-09-20 MED ORDER — LOSARTAN POTASSIUM-HCTZ 50-12.5 MG PO TABS: 1.0000 | ORAL_TABLET | Freq: Every day | ORAL | 1 refills | Status: AC

## 2023-09-20 NOTE — Addendum Note (Signed)
 Addended by: ALVIA VELMA BRAVO on: 09/20/2023 01:06 PM   Modules accepted: Orders

## 2023-09-20 NOTE — Patient Instructions (Addendum)
 Pt pcp has changed medication to losartan -hydrochlorothiazide, pt is to return to see pcp in 3 months

## 2023-09-20 NOTE — Progress Notes (Signed)
 Pt presents in office for blood pressure recheck last visit pt had elevated blood pressure readings, last bp was 162/84,pt medication increased from losartan  25 mg to 50 mg ,today her blood pressure reading is 115/67 after sitting for a few minutes her second blood pressure reading was 150/58,pt states her home pressure readings are between 150-130 systolic and 70-80 diastolic  Pt pcp has changed medication to losartan -hydrochlorothiazide, pt is to return to see pcp in 3 months

## 2023-09-25 ENCOUNTER — Other Ambulatory Visit: Payer: Self-pay | Admitting: Family Medicine

## 2023-09-25 DIAGNOSIS — I499 Cardiac arrhythmia, unspecified: Secondary | ICD-10-CM | POA: Diagnosis not present

## 2023-10-01 ENCOUNTER — Encounter: Payer: Self-pay | Admitting: Family Medicine

## 2023-10-01 MED ORDER — CETIRIZINE HCL 10 MG PO TABS: 10.0000 mg | ORAL_TABLET | Freq: Every day | ORAL | 11 refills | Status: AC

## 2023-10-05 ENCOUNTER — Ambulatory Visit: Admitting: Family Medicine

## 2023-10-12 DIAGNOSIS — R413 Other amnesia: Secondary | ICD-10-CM | POA: Diagnosis not present

## 2023-10-12 DIAGNOSIS — R251 Tremor, unspecified: Secondary | ICD-10-CM | POA: Diagnosis not present

## 2023-10-15 ENCOUNTER — Ambulatory Visit (INDEPENDENT_AMBULATORY_CARE_PROVIDER_SITE_OTHER): Admitting: Licensed Clinical Social Worker

## 2023-10-15 ENCOUNTER — Other Ambulatory Visit: Payer: Self-pay

## 2023-10-15 DIAGNOSIS — E119 Type 2 diabetes mellitus without complications: Secondary | ICD-10-CM

## 2023-10-15 DIAGNOSIS — F411 Generalized anxiety disorder: Secondary | ICD-10-CM | POA: Diagnosis not present

## 2023-10-15 MED ORDER — SEMAGLUTIDE (1 MG/DOSE) 4 MG/3ML ~~LOC~~ SOPN: 1.0000 mg | PEN_INJECTOR | SUBCUTANEOUS | 0 refills | Status: AC
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
## 2023-10-19 LAB — MICROALBUMIN / CREATININE URINE RATIO: Microalb Creat Ratio: 6

## 2023-10-21 ENCOUNTER — Other Ambulatory Visit: Payer: Self-pay | Admitting: Family Medicine

## 2023-10-25 ENCOUNTER — Other Ambulatory Visit (HOSPITAL_COMMUNITY)
Admission: RE | Admit: 2023-10-25 | Discharge: 2023-10-25 | Disposition: A | Discharge status: Home / Self Care | Payer: Self-pay | Source: Ambulatory Visit | Attending: Medical Genetics | Admitting: Medical Genetics

## 2023-10-26 ENCOUNTER — Other Ambulatory Visit: Payer: Self-pay | Admitting: Family Medicine

## 2023-10-26 DIAGNOSIS — C50011 Malignant neoplasm of nipple and areola, right female breast: Secondary | ICD-10-CM | POA: Diagnosis not present

## 2023-10-26 DIAGNOSIS — Z170 Estrogen receptor positive status [ER+]: Secondary | ICD-10-CM | POA: Diagnosis not present

## 2023-10-26 NOTE — Telephone Encounter (Unsigned)
 Copied from CRM #[id number]. Topic: Clinical - Medication Refill >> Oct 26, 2023 10:52 AM Turkey B wrote: Medication: simvastatin  (ZOCOR ) 20 MG tablet Requested 100 day supply  Has the patient contacted their pharmacy? yes (Agent: If yes, when and what did the pharmacy advise?)[facility] called in for this request  This is the patient's preferred pharmacy:  [facility] [address], [place] - [address] [place] [address] Phone: [phone] Fax: [phone]  Is this the correct pharmacy for this prescription? yes    Has the prescription been filled recently? no  Is the patient out of the medication? No has 3 weeks left  Has the patient been seen for an appointment in the last year OR does the patient have an upcoming appointment? yes  Can we respond through MyChart? yes  Agent: Please be advised that Rx refills may take up to 3 business days. We ask that you follow-up with your pharmacy.

## 2023-10-29 ENCOUNTER — Other Ambulatory Visit: Payer: Self-pay | Admitting: Family Medicine

## 2023-10-29 NOTE — Telephone Encounter (Signed)
 Copied from CRM #[id number]. Topic: Clinical - Medication Refill >> Oct 29, 2023 11:36 AM Rosaria BRAVO wrote: Medication:  metFORMIN  (GLUCOPHAGE ) 1000 MG tablet  Has the patient contacted their pharmacy? Yes (Agent: If no, request that the patient contact the pharmacy for the refill. If patient does not wish to contact the pharmacy document the reason why and proceed with request.) (Agent: If yes, when and what did the pharmacy advise?)  This is the patient's preferred pharmacy:  [facility] [address], [place] - [address] [place] [address] Phone: [phone] Fax: [phone]  Is this the correct pharmacy for this prescription? Yes If no, delete pharmacy and type the correct one.   Has the prescription been filled recently? Yes  Is the patient out of the medication? No.   Has the patient been seen for an appointment in the last year OR does the patient have an upcoming appointment? Yes  Can we respond through MyChart? Yes  Agent: Please be advised that Rx refills may take up to 3 business days. We ask that you follow-up with your pharmacy.

## 2023-10-30 MED ORDER — METFORMIN HCL 1000 MG PO TABS: 1000.0000 mg | ORAL_TABLET | Freq: Two times a day (BID) | ORAL | 0 refills | Status: DC

## 2023-10-31 ENCOUNTER — Encounter: Payer: Self-pay | Admitting: Family Medicine

## 2023-10-31 ENCOUNTER — Telehealth: Payer: Self-pay

## 2023-10-31 NOTE — Telephone Encounter (Signed)
 Pattie declined colonoscopy or Cologuard.

## 2023-11-06 ENCOUNTER — Other Ambulatory Visit: Payer: Self-pay | Admitting: Family Medicine

## 2023-11-06 DIAGNOSIS — E1169 Type 2 diabetes mellitus with other specified complication: Secondary | ICD-10-CM

## 2023-11-20 ENCOUNTER — Other Ambulatory Visit: Payer: Self-pay | Admitting: Family Medicine

## 2023-11-20 ENCOUNTER — Ambulatory Visit (INDEPENDENT_AMBULATORY_CARE_PROVIDER_SITE_OTHER): Admitting: Licensed Clinical Social Worker

## 2023-11-20 DIAGNOSIS — F411 Generalized anxiety disorder: Secondary | ICD-10-CM

## 2023-11-20 DIAGNOSIS — I629 Nontraumatic intracranial hemorrhage, unspecified: Secondary | ICD-10-CM

## 2023-11-20 DIAGNOSIS — I4891 Unspecified atrial fibrillation: Secondary | ICD-10-CM

## 2023-11-20 NOTE — Progress Notes (Signed)
 THERAPIST PROGRESS NOTE  Session Time: 3:00 PM to 3:45 PM  Participation Level: Active  Behavioral Response: CasualAlertEuthymic  Type of Therapy: Individual Therapy  Treatment Goals addressed: Explore symptoms of anxiety how severe, coping  ProgressTowards Goals: Progressing-helpful to process thoughts and feelings related to current events also to share past memories mood enhancing helpful for wellbeing and mental health  Interventions: Solution Focused, Strength-based, Supportive, Reframing, and Other: coping  Summary: Jasmine Montgomery is a 73 y.o. female who presents with saw neurologist and on 17 or 19 two and half hours test not sure what to expect.  Both patient and therapist agreed to expect some memory loss she will be 74 in March.  Related to the incident where she missed a turn and was not thinking about it turned around and said miss a turn but really enjoyed her ride home.  That is the reasons for seeing a neurologist.  Therapist noted there is good coping in the part where she sees the positive and experience something when off track and still able to appreciate it very good coping.  Therapist shared about a book she is reading practicing lately noticing the positive even the small things.  Read to her from the book about how we have more than we think cannot basic needs that are being met such as waking up, breathing fresh air electricity patient agrees with that sentiment.  Patient shared in fact a member she had when little stands out to her mom had to tell the man from the electric company could not pay the bill so these things stand out appreciation for these things. Therapist shared about her appreciation of life with medical issues patient has that same concept she had a bilateral mastectomy schedule a few time due to COVID, four hour heart surgery in January appreciate life what is given in our hands.  Talked about her family trip how much she enjoyed it but also how crazy  her family is.    Both parents they loved mom harder to get to know. [place] parents met at [employer] [place] . Dad and Surveyor, Minerals at college and where met mom. Had an aunt engaged to her after mom nobody else. Everybody likes their Wylie Saa. Dad a education officer, environmental. They were in [place]  where he had four older brothers had a four square church. They were there 5 years then back to [place] . Mom pianist and karalee. It was a tough time Dad already a Sharyne so worked as well as furniture conservator/restorer paid much for [employer]. Every one loved Dad lucky to know him type of guy chaplain in prison. Sophomore in high school went with brother's youth group to [place] played guitar and sing. 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  [place] Kale found her church. Sermon not even thought about things heard what saying John when Jesus healed the blind man.  Patient shared at end of life her father went blind he was still amazing man. He listened to the Bible on tape. Every other month spend time with Dad 2nd husband encouraged that. Took him to doctor's and had a great time. Both of us  talked about spending time with parents and really enjoying it. Patient said remember when mom passed. Wish had been there knew where she was being taken was on the phone when giving CPR patient a nurse. Hardest on oldest son not allowed to be there for funeral. He was 8. Patient ended up going with brother with  him and wife. They were left 26 year old sister taking care of them and Dad had falling in snow and broken ankle. They were all close with mom. Next in line dad all loved him. Other grandmother they loved her too.  Therapist wonders how her husband can turn up to what he did and patient explains they were taken from her left her husband left her in the [place] a bad area. He left her with no electricity.  Therapist assesses positive session reminiscing reminiscing as enhancing the experiences helpful  for mental health the positive experiences even ones not as positive mental health enhancing to share these experiences sharing insights about life and being appreciative of life.  Suicidal/Homicidal: No Work on treatment goals processed thoughts and feelings in session help with coping, check in with neurological test see how that is going and other stressors  Plan: Return again in 4 weeks.2. Work on treatment goals processed thoughts and feelings in session help with coping  Diagnosis: Anxiety state  Collaboration of Care: Other none needed  Patient/Guardian was advised Release of Information must be obtained prior to any record release in order to collaborate their care with an outside provider. Patient/Guardian was advised if they have not already done so to contact the registration department to sign all necessary forms in order for us  to release information regarding their care.   Consent: Patient/Guardian gives verbal consent for treatment and assignment of benefits for services provided during this visit. Patient/Guardian expressed understanding and agreed to proceed.   Ronal Sink, LCSW 11/20/2023

## 2023-11-28 ENCOUNTER — Telehealth: Payer: Self-pay | Admitting: Medical Genetics

## 2023-11-28 DIAGNOSIS — Z1501 Genetic susceptibility to malignant neoplasm of breast: Secondary | ICD-10-CM | POA: Insufficient documentation

## 2023-11-28 NOTE — Telephone Encounter (Signed)
 [place] GeneConnect Positive Result Note 11/28/2023 2:41 PM  FIRST ATTEMPT: Confirmed I was speaking with Jasmine Montgomery [id number] by using name and DOB. Informed participant the reason for this call is to provide results for the above study. Results revealed Lynch Syndrome. Genetic counseling was offered and participant requests a call back to schedule. All questions were answered, and participant was thanked for their time and support of the above study. Participant was encouraged to contact [facility] if they have any further questions or concerns.

## 2023-11-28 NOTE — Telephone Encounter (Signed)
 [place] GeneConnect 11/28/2023 4:25 PM  Confirmed I was speaking with Jasmine Montgomery [id number] by using name and DOB. Genetic counseling was offered and participant is scheduled. All questions were answered, and participant was thanked for their time and support of the above study. Participant was encouraged to contact [facility] if they have any further questions or concerns.    Jordyn Pennstrom, BS Zena  [facility] Department Clinical Research Specialist II Direct Dial: [phone]  Fax: [phone]

## 2023-12-10 ENCOUNTER — Other Ambulatory Visit: Payer: Self-pay | Admitting: Family Medicine

## 2023-12-11 ENCOUNTER — Encounter: Payer: Self-pay | Admitting: Genetic Counselor

## 2023-12-11 ENCOUNTER — Ambulatory Visit: Admitting: Genetic Counselor

## 2023-12-11 DIAGNOSIS — Z1501 Genetic susceptibility to malignant neoplasm of breast: Secondary | ICD-10-CM

## 2023-12-11 DIAGNOSIS — Z8673 Personal history of transient ischemic attack (TIA), and cerebral infarction without residual deficits: Secondary | ICD-10-CM | POA: Insufficient documentation

## 2023-12-11 DIAGNOSIS — F4323 Adjustment disorder with mixed anxiety and depressed mood: Secondary | ICD-10-CM | POA: Insufficient documentation

## 2023-12-11 DIAGNOSIS — R419 Unspecified symptoms and signs involving cognitive functions and awareness: Secondary | ICD-10-CM | POA: Insufficient documentation

## 2023-12-11 DIAGNOSIS — Z803 Family history of malignant neoplasm of breast: Secondary | ICD-10-CM

## 2023-12-11 DIAGNOSIS — C50919 Malignant neoplasm of unspecified site of unspecified female breast: Secondary | ICD-10-CM

## 2023-12-11 NOTE — Progress Notes (Signed)
 PRIMARY PROVIDER:  Alvia Bring, DO   PRIMARY REASON FOR VISIT:  1. MSH6 gene mutation positive   2. Malignant neoplasm of female breast, unspecified estrogen receptor status, unspecified laterality, unspecified site of breast (HCC)   3. Family history of malignant neoplasm of breast     Jasmine Montgomery, a 73 y.o. female, was seen for a [facility] genetic counseling as follow up to her participation in the [facility] Study, part of the [facility] DNA Research Program. 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   RELEVANT MEDICAL HISTORY:  In 2022, at the age of 27, Jasmine Montgomery was diagnosed with breast cancer of the right breast, with two primary breast cancers in the right breast. One was ER, PR, Her2+, while the other was ER, PR +, Her2-. She had a bilateral mastectomies and was treated with chemotherapy, radiation, and anastrozole. She sees her oncologist yearly.     No history of colonoscopy, ovaries and uterus intact.   She does have a history of high cholesterol, treated with statins.   Past Medical History:  Diagnosis Date   A-fib (HCC)    Cancer (HCC) 08/2019   Diabetes mellitus without complication (HCC)    History of breast cancer    Hyperlipidemia    Hypertension    Stroke [facility])     Past Surgical History:  Procedure Laterality Date   BILATERAL TOTAL MASTECTOMY WITH AXILLARY LYMPH NODE DISSECTION  02/2020   BREAST SURGERY  03/2020   HERNIA REPAIR  2010   SMALL INTESTINE SURGERY  03/2016    Social History   Socioeconomic History   Marital status: Widowed    Spouse name: Not on file   Number of children: 3   Years of education: 14   Highest education level: Associate degree: academic program  Occupational History   Occupation: Retired    Comment: retired engineer, civil (consulting)  Tobacco Use   Smoking status: Former    Current packs/day: 0.50     Average packs/day: 0.5 packs/day for 46.4 years (23.2 ttl pk-yrs)    Types: Cigarettes    Start date: 09/13/1962    Quit date: 06/16/1982   Smokeless tobacco: Never  Vaping Use   Vaping status: Never Used  Substance and Sexual Activity   Alcohol use: Not Currently   Drug use: Not Currently    Types: Marijuana   Sexual activity: Not Currently    Partners: Female, Female    Birth control/protection: Post-menopausal  Other Topics Concern   Not on file  Social History Narrative   Lives with her daughter and her family. She enjoys quilting and painting pillow cases.   Social Drivers of Corporate Investment Banker Strain: Low Risk  (08/08/2023)   Overall Financial Resource Strain (CARDIA)    Difficulty of Paying Living Expenses: Not hard at all  Food Insecurity: No Food Insecurity (08/08/2023)   Hunger Vital Sign    Worried About Running Out of Food in the Last Year: Never true    Ran Out of Food in the Last Year: Never true  Transportation Needs: No Transportation Needs (08/08/2023)   PRAPARE - Administrator, Civil Service (Medical): No    Lack of Transportation (Non-Medical): No  Physical Activity: Sufficiently Active (08/08/2023)   Exercise Vital Sign    Days of Exercise per Week: 4 days    Minutes of  Exercise per Session: 40 min  Stress: No Stress Concern Present (08/08/2023)   [employer] of Occupational Health - Occupational Stress Questionnaire    Feeling of Stress: Not at all  Social Connections: Moderately Integrated (08/08/2023)   Social Connection and Isolation Panel    Frequency of Communication with Friends and Family: More than three times a week    Frequency of Social Gatherings with Friends and Family: More than three times a week    Attends Religious Services: More than 4 times per year    Active Member of [employer] or Organizations: Yes    Attends Banker Meetings: More than 4 times per year    Marital Status: Widowed     FAMILY HISTORY:   We obtained a detailed, 3-generation family history.  Significant diagnoses are listed below: Family History  Problem Relation Age of Onset   Hypertension Mother    Miscarriages / Stillbirths Mother    Varicose Veins Mother    Kidney cancer Father 36 - 12   Breast cancer Sister 42 - 53   Diabetes Brother    Cancer Paternal Grandmother    Obesity Paternal Grandmother    Breast cancer Niece 23   Epilepsy Niece     Jasmine Montgomery is aware of previous family history of genetic testing, as her daughter also participated in the GeneConnect study and was found to carry the same MSH6 variants. There is no reported Ashkenazi Jewish ancestry. There is no known consanguinity. Patient reports race as Caucasian.       GENETIC TEST RESULTS:  Jasmine Montgomery participated in the [place] GeneConnect population genomic screening program. Two likely pathogenic variants were detected in the MSH6 gene called c.3601C>G (p.Leu1201Val) and c.3724C>A (p.Arg1242Ser). Per [facility], these variants are most likely in cis (on the same copy of the gene) and have been reported to be in complete linkage disequilibrium. This haplotype (section of DNA or variants that are inherited together) has been reported in other individuals with MSH6-related cancers and when identified together are classified as likely pathogenic. There were no pathogenic or likely pathogenic variants detected in other genes reported on in this study.   [facility] Tier One Population Screen is a screening test that analyzes 11 genes related to hereditary breast and ovarian cancer syndrome (HBOC), Lynch syndrome, and familial hypercholesterolemia. These include APOB, BRCA1, BRCA2, EPCAM, LDLR, LDLRAP1, PCSK9, PMS2 (excluding exons 11-15), MLH1, MSH2, MSH6. This test reports pathogenic and likely pathogenic variants, but does not report on variants of unknown significance. The absence of any additional pathogenic variants is reassuring, but does not rule out a  hereditary condition. There are other variants and genes associated with heart disease, hereditary cancer and other inherited conditions that were not included in this test. Please see full test report in labs, labeled GeneConnect Molecular Screen, dated 10/25/23. A section of the report is included below.     CLINICAL INFORMATION:  Pathogenic variants or mutations in MSH6 are associated with a condition called Lynch syndrome. Lynch syndrome increases risk for colorectal, endometrial, ovarian, urinary tract, and other cancers. The cancer risks and management recommendations associated with MSH6 mutations are listed below:  Type of Cancer MSH6 Mutation Lifetime Risk Average Lifetime Risk  Colorectal 10-44% 4.1%  Endometrial (uterus) 16-49% 3.1%  Ovarian Up to 1.0-13% 1.1%  Renal pelvis/ureter 0.7-5.5% -   Bladder 1.0-8.2% 2.3%  Gastric (stomach) Up to 1.0-7.9% 0.8%  Small bowel Up to 1.0-4.0% 0.3%  Pancreas 1.4-1.6% 1.7%  Biliary Tract Up to  1% -  Prostate 2.5-11.6% 12.6%  Brain 0.8-1.8% 0.5%   Benign and malignant skin tumors such as sebaceous adenomas, sebaceous adenocarcinomas, and keratoacanthomas has been reported to be increased among patients with Lynch syndrome. Cumulative lifetime risk specific to MSH6 mutation carriers is not available.  Risk numbers are based on NCCN v1.2025. Risk estimates may evolve over time as new information is learned about MSH6 mutations.    Management Recommendations: Per NCCN v1.2025  Colorectal Cancer Screening: High quality colonoscopy at age 25-35 (or 2-5 years prior to the earliest colon cancer if it is diagnosed before age 91) and repeat every 1-3 years Consider using daily aspirin to reduce the risk of colorectal cancer. The decision to use aspirin should be made on an individual basis, including discussion of individual risks, benefits, adverse effects, and childbearing plans with a healthcare provider.  Jasmine Montgomery has not had a colonoscopy  and is not interested in having one. She plans to discuss options for cologuard screening with her PCP and/or oncologist, understanding that colonoscopy is the standard of care of individuals identified to have likely pathogenic variants in MSH6. Referred to GI was declined.   Endometrial Cancer Screening/Risk Reduction: Women should report any abnormal uterine bleeding or postmenopausal bleeding. The evaluation of these symptoms should include an endometrial biopsy. A hysterectomy may be considered. The timing should be individualized based on whether childbearing is complete, comorbidities, and family history.  Hysterectomy (surgery to remove the uterus) with opportunistic salpingectomy (elective removal of both fallopian tubes during another abdominal surgery to reduce risk for ovarian cancer) can be considered at age 44, with bilateral oophorectomy (surgery to remove ovaries) at age 37. Endometrial cancer screening does not have a proven benefit in women with Lynch Syndrome. However, endometrial biopsy is highly sensitive and specific as a diagnostic procedure. Screening via endometrial biopsy every 1-2 years starting at age 4-35 can be considered. Transvaginal ultrasounds may be considered in postmenopausal women at their clinician's discretion. Jasmine Montgomery declined a referral to Gyn Oncology, plans to discuss surveillance options with her oncologist.   Ovarian Cancer Screening/Risk Reduction: A prophylactic bilateral salpingo-oophorectomy (BSO), or having the ovaries and fallopian tubes removed, may be considered. A BSO is estimated to reduce the risk of ovarian cancer by up to 96%. Timing of a BSO should be individualized based on whether childbearing is complete, menopause status, comorbidities, and family history.  Hysterectomy (surgery to remove the uterus) with opportunistic salpingectomy (elective removal of both fallopian tubes during another abdominal surgery to reduce risk for ovarian  cancer) can be considered at age 55, with bilateral oophorectomy (surgery to remove ovaries) at age 7. Women should be aware of symptoms that might be associated with the development of ovarian cancer including pelvic or abdominal pain, bloating, increased abdominal girth, difficulty eating, early satiety, or urinary frequency or urgency. Symptoms that persist for several weeks and are a change from a woman's baseline should prompt evaluation by her physician. Current data does not support routine ovarian screening for Lynch syndrome, therefore it may be considered at the clinician's discretion. Screening includes transvaginal ultrasounds and a blood test to measure CA-125 levels every 6-12 months.  Urothelial Cancer Screening: Annual urinalysis starting at age 94-35 may be considered in selected individuals such as those with a family history of urothelial cancer (renal pelvis, ureter, and/or bladder). Does complete urinalysis yearly with her PCP, will discuss screening for urinary tract cancers with PCP    Gastric and Small Bowel Cancer Screening: Esophagogastroduodenoscopy (EGD)  starting at age 68-40 years and repeat every 2-4 years, preferably performed in conjunction with colonoscopy.  Age of initiation prior to 30 years and/or surveillance interval less than 2 years may be considered based on family history or high-risk endoscopic findings. Random biopsy of the proximal and distal stomach should at minimum be performed on the initial procedure to assess for H. Pylori, autoimmune gastritis, and intestinal metaplasia.  Push enteroscopy can be considered in place of EGD to enhance small bowel visualization. Individuals not undergoing upper endoscopic surveillance should have one-time noninvasive testing for H. pylori at the time of Lynch Syndrome diagnosis, with treatment indicated if H. pylori is detected.    Pancreatic Cancer Screening: Avoid smoking, heavy alcohol use, and obesity. It has  been suggested that pancreatic cancer screening be limited to those with a family history of pancreatic cancer (first- or second-degree relative). Ideally, screening should be performed in experienced centers utilizing a multidisciplinary approach under research conditions. Recommended screening include annual endoscopic ultrasound (preferred) and/or MRI of the pancreas starting at age 35 or 7 years younger than the earliest age diagnosis in the family. Annual concurrent CA19-9 testing should also be considered.  Brain Cancer Screening: Patients should be educated regarding signs and symptoms of neurologic cancer and the importance of prompt reporting of abnormal symptoms to their physicians.  Prostate Cancer Screening: Consider beginning annual PSA blood test and baseline digital rectal exam at age 6.  Skin Manifestations:  Consider skin exam every 1-2 years with a healthcare provider experienced in identifying skin manifestations of Lynch syndrome.   Additional Considerations: Patients of reproductive age should be made aware of options for prenatal diagnosis and assisted reproduction including pre-implantation genetic diagnosis. Individuals with a single pathogenic MSH6 variant are also carriers of constitutional mismatch repair deficiency (CMMRD) syndrome. CMMRD is a childhood-onset cancer predisposition syndrome that can present with hematological malignancies, cancers of the brain and central nervous system, Lynch syndrome-associated cancers (colon, uterine, small bowel, urinary tract), embryonic tumors, and sarcomas. Some affected individuals may also display cafe-au-lait macules. For there to be a risk of CMMRD in offspring, an individual and their partner would each have to have a single pathogenic variant in the same MMR gene; in such a case, the risk of having an affected child is 25%.  This information is based on current understanding of the gene and may change in the future.     IMPLICATIONS FOR FAMILY MEMBERS: Individuals with MSH6 are encouraged to share information about their genetic test results with family members. A family letter will be provided to help share this result with relatives. "Cascade screening" is a systematic process through which additional family members with a MSH6 mutation are identified, starting with all first degree relatives over the age of 67 (parents, siblings, and children) of people who have a MSH6 mutation.   Hereditary predisposition to cancer due to pathogenic variants in the MSH6 gene has autosomal dominant inheritance. This means that an individual with a pathogenic variant has a 50% chance of passing the condition on to their children. Identification of a pathogenic variant allows for the recognition of at-risk relatives who can pursue testing for the familial variant.  Family members are encouraged to consider genetic testing for this familial pathogenic variant. As there are generally no childhood cancer risks associated with pathogenic variants in the MSH6 gene, individuals in the family are not typically recommended to have testing until they reach at least 71 years of age. They may contact our office  at [phone] for more information or to schedule an appointment. Family members who live outside of the area are encouraged to find a genetic counselor in their area by visiting: [email or web address].  RESOURCES: Facing Our Risk of Cancer Empowered (FORCE): www.facingourrisk.org   Colon Cancer Alliance for Research and Education for Lynch Syndrome: snitchseek.be  ICARE Inherited Cancer Registry: https://inheritedcancer.net/  ADDITIONAL TESTING: Meets NCCN criteria for additional genetic testing to be recommended. Scheduled for 12/17/23 at 11 am, telephone.   PLAN:  Referral to Gastroenterology is recommended to further discuss options for colon cancer screening and prevention. Jasmine Montgomery declined.  Will discuss options for colon cancer screening with her PCP and/or oncologist.  Referral to [facility] Gynecologic Oncology is recommended to further discuss options for gynecologic cancer prevention. Jasmine Montgomery declined. Will discuss with her oncologist.   Results will be shared with Jasmine Montgomery's primary care provider, Alvia Bring, DO, and oncologist, Damien Berg, MD,  for further management.   Genetic test results should be shared with relatives, who can consider undergoing genetic testing for the MSH6 likely pathogenic variants.   Lastly, we encouraged Jasmine Montgomery to remain in contact with [facility]'s genetics team so that we can continuously update the family history and inform her of any changes in our understanding of Lynch syndrome and any additional genetic testing that may be of benefit for this family.   Jasmine Montgomery questions were answered to her satisfaction today. Our contact information was provided should additional questions or concerns arise.   Burnard Ogren, MS, [facility] Licensed, [email or web address] phone: [phone]  60 minutes were spent on the date of the encounter in service to the patient including preparation, face-to-face consultation, documentation and care coordination.  The patient's daughter was present for this call. Dr. Chad Haldeman-Englert was available for questions, if needed.   I connected with  Jasmine Montgomery on 12/11/2023 at 10:30 am EDT by telephone and verified that I am speaking with the correct person using two identifiers.   Patient location: home Provider location: [facility]  __________________________________________________________________ For Office Staff:  Number of people involved in session: 2 Was an Intern/ student involved with case: yes. [employer] Intern Andie Roylance participated in this visit.

## 2023-12-17 ENCOUNTER — Other Ambulatory Visit: Payer: Self-pay | Admitting: Family Medicine

## 2023-12-17 ENCOUNTER — Telehealth: Payer: Self-pay | Admitting: Genetic Counselor

## 2023-12-17 ENCOUNTER — Inpatient Hospital Stay: Admitting: Genetic Counselor

## 2023-12-17 NOTE — Telephone Encounter (Signed)
 Patient forgot about phone appt. Rescheduled for next week 12/24/23 at 11 am.

## 2023-12-17 NOTE — Telephone Encounter (Signed)
 LVM regarding genetic counseling appointment scheduled for 12/17/23 at 11 am.

## 2023-12-20 ENCOUNTER — Ambulatory Visit (INDEPENDENT_AMBULATORY_CARE_PROVIDER_SITE_OTHER): Admitting: Family Medicine

## 2023-12-20 ENCOUNTER — Encounter: Payer: Self-pay | Admitting: Family Medicine

## 2023-12-20 VITALS — BP 119/73 | HR 86 | Ht 64.0 in | Wt 221.0 lb

## 2023-12-20 DIAGNOSIS — E119 Type 2 diabetes mellitus without complications: Secondary | ICD-10-CM | POA: Diagnosis not present

## 2023-12-20 DIAGNOSIS — Z7984 Long term (current) use of oral hypoglycemic drugs: Secondary | ICD-10-CM | POA: Diagnosis not present

## 2023-12-20 DIAGNOSIS — E785 Hyperlipidemia, unspecified: Secondary | ICD-10-CM | POA: Diagnosis not present

## 2023-12-20 DIAGNOSIS — I10 Essential (primary) hypertension: Secondary | ICD-10-CM

## 2023-12-20 DIAGNOSIS — G250 Essential tremor: Secondary | ICD-10-CM | POA: Diagnosis not present

## 2023-12-20 DIAGNOSIS — Z1211 Encounter for screening for malignant neoplasm of colon: Secondary | ICD-10-CM

## 2023-12-20 DIAGNOSIS — E1169 Type 2 diabetes mellitus with other specified complication: Secondary | ICD-10-CM | POA: Diagnosis not present

## 2023-12-20 LAB — POCT GLYCOSYLATED HEMOGLOBIN (HGB A1C): HbA1c, POC (controlled diabetic range): 6.1 % (ref 0.0–7.0)

## 2023-12-20 NOTE — Progress Notes (Signed)
 Jasmine Montgomery - 73 y.o. female MRN [id number]  Date of birth: 10/04/1950  Subjective Chief Complaint  Patient presents with   Diabetes   Hypertension    HPI Jasmine Montgomery is a 73 y.o. female here today for follow up visit.   She reports that she is doing pretty well..   BP elevated on initial check.  She is taking medicaitons as directed.  Denies symptoms related to blood pressure including chest pain, shortness of breath, increased palpitations, headaches or vision change.  Blood sugars remain well-controlled.  Remains on Ozempic  and metformin .  Tolerating well at current strength.  Weight is stable.  She is tolerating simvastatin  well for associated hyperlipidemia.  ROS:  A comprehensive ROS was completed and negative except as noted per HPI    Allergies[1]  Past Medical History:  Diagnosis Date   A-fib (HCC)    Cancer (HCC) 08/2019   Diabetes mellitus without complication (HCC)    History of breast cancer    Hyperlipidemia    Hypertension    Stroke [facility])     Past Surgical History:  Procedure Laterality Date   BILATERAL TOTAL MASTECTOMY WITH AXILLARY LYMPH NODE DISSECTION  02/2020   BREAST SURGERY  03/2020   HERNIA REPAIR  2010   SMALL INTESTINE SURGERY  03/2016    Social History   Socioeconomic History   Marital status: Widowed    Spouse name: Not on file   Number of children: 3   Years of education: 14   Highest education level: Associate degree: academic program  Occupational History   Occupation: Retired    Comment: retired engineer, civil (consulting)  Tobacco Use   Smoking status: Former    Current packs/day: 0.50    Average packs/day: 0.5 packs/day for 46.4 years (23.2 ttl pk-yrs)    Types: Cigarettes    Start date: 09/13/1962    Quit date: 06/16/1982   Smokeless tobacco: Never  Vaping Use   Vaping status: Never Used  Substance and Sexual Activity   Alcohol use: Not Currently   Drug use: Not Currently    Types: Marijuana   Sexual activity: Not Currently     Partners: Female, Female    Birth control/protection: Post-menopausal  Other Topics Concern   Not on file  Social History Narrative   Lives with her daughter and her family. She enjoys quilting and painting pillow cases.   Social Drivers of Health   Tobacco Use: Medium Risk (12/20/2023)   Patient History    Smoking Tobacco Use: Former    Smokeless Tobacco Use: Never    Passive Exposure: Not on file  Financial Resource Strain: Low Risk (08/08/2023)   Overall Financial Resource Strain (CARDIA)    Difficulty of Paying Living Expenses: Not hard at all  Food Insecurity: No Food Insecurity (08/08/2023)   Epic    Worried About Programme Researcher, Broadcasting/film/video in the Last Year: Never true    Ran Out of Food in the Last Year: Never true  Transportation Needs: No Transportation Needs (08/08/2023)   Epic    Lack of Transportation (Medical): No    Lack of Transportation (Non-Medical): No  Physical Activity: Sufficiently Active (08/08/2023)   Exercise Vital Sign    Days of Exercise per Week: 4 days    Minutes of Exercise per Session: 40 min  Stress: No Stress Concern Present (08/08/2023)   [employer] of Occupational Health - Occupational Stress Questionnaire    Feeling of Stress: Not at all  Social Connections: Moderately Integrated (08/08/2023)  Social Connection and Isolation Panel    Frequency of Communication with Friends and Family: More than three times a week    Frequency of Social Gatherings with Friends and Family: More than three times a week    Attends Religious Services: More than 4 times per year    Active Member of [employer] or Organizations: Yes    Attends Banker Meetings: More than 4 times per year    Marital Status: Widowed  Depression (PHQ2-9): Low Risk (08/08/2023)   Depression (PHQ2-9)    PHQ-2 Score: 0  Alcohol Screen: Low Risk (08/08/2023)   Alcohol Screen    Last Alcohol Screening Score (AUDIT): 0  Housing: Low Risk (08/08/2023)   Epic    Unable to Pay for Housing  in the Last Year: No    Number of Times Moved in the Last Year: 0    Homeless in the Last Year: No  Utilities: Not At Risk (08/08/2023)   Epic    Threatened with loss of utilities: No  Health Literacy: Adequate Health Literacy (08/08/2023)   B1300 Health Literacy    Frequency of need for help with medical instructions: Never    Family History  Problem Relation Age of Onset   Hypertension Mother    Miscarriages / Stillbirths Mother    Varicose Veins Mother    Kidney cancer Father 7 - 22   Breast cancer Sister 64 - 23   Diabetes Brother    Stroke Maternal Grandfather    Obesity Paternal Grandmother    Uterine cancer Paternal Grandmother 64 - 26   Breast cancer Niece 61   Epilepsy Niece    Cancer Other        GI? paternal first cousin once removed   Cancer Paternal Cousin        kidney    Health Maintenance  Topic Date Due   Hepatitis C Screening  Never done   Zoster Vaccines- Shingrix  (1 of 2) Never done   Colonoscopy  Never done   COVID-19 Vaccine (5 - 2025-26 season) 09/10/2023   Bone Density Scan  01/18/2024   Influenza Vaccine  04/08/2024 (Originally 08/10/2023)   HEMOGLOBIN A1C  06/19/2024   Diabetic kidney evaluation - eGFR measurement  07/04/2024   Medicare Annual Wellness (AWV)  08/07/2024   Diabetic kidney evaluation - Urine ACR  10/18/2024   OPHTHALMOLOGY EXAM  11/05/2024   FOOT EXAM  12/19/2024   DTaP/Tdap/Td (3 - Td or Tdap) 09/30/2025   Pneumococcal Vaccine: 50+ Years  Completed   Meningococcal B Vaccine  Aged Out   Mammogram  Discontinued     ----------------------------------------------------------------------------------------------------------------------------------------------------------------------------------------------------------------- Physical Exam BP 119/73   Pulse 86   Ht 5' 4 (1.626 m)   Wt 221 lb (100.2 kg)   SpO2 96%   BMI 37.93 kg/m   Physical Exam Constitutional:      Appearance: Normal appearance.  Eyes:     General: No  scleral icterus. Cardiovascular:     Rate and Rhythm: Normal rate and regular rhythm.  Pulmonary:     Effort: Pulmonary effort is normal.     Breath sounds: Normal breath sounds.  Musculoskeletal:     Cervical back: Neck supple.  Neurological:     General: No focal deficit present.     Mental Status: She is alert.  Psychiatric:        Mood and Affect: Mood normal.        Behavior: Behavior normal.     ------------------------------------------------------------------------------------------------------------------------------------------------------------------------------------------------------------------- Assessment and Plan  Type 2 diabetes mellitus without complication, without long-term current use of insulin (HCC) Blood sugars are better controlled.  Continue metformin  with Rybelsus .  Encouraged continued dietary changes.    Essential tremor Tremor has increased and she is not experiencing some at rest.  She does have referral to neurology.  Essential hypertension Blood pressure has been elevated.  Continue current medications.  Low-sodium diet encouraged.  Return in 2 weeks for blood pressure recheck.  Hyperlipidemia associated with type 2 diabetes mellitus (HCC) Continue simvastatin  at current strength.   No orders of the defined types were placed in this encounter.   Return in about 6 months (around 06/19/2024) for Hypertension, Type 2 Diabetes.         [1] No Known Allergies

## 2023-12-23 NOTE — Assessment & Plan Note (Signed)
Blood sugars are better controlled.  Continue metformin with Rybelsus.  Encouraged continued dietary changes.

## 2023-12-23 NOTE — Assessment & Plan Note (Signed)
Continue simvastatin at current strength.   

## 2023-12-23 NOTE — Assessment & Plan Note (Signed)
 Tremor has increased and she is not experiencing some at rest.  She does have referral to neurology.

## 2023-12-23 NOTE — Assessment & Plan Note (Signed)
 Blood pressure has been elevated.  Continue current medications.  Low-sodium diet encouraged.  Return in 2 weeks for blood pressure recheck.

## 2023-12-24 ENCOUNTER — Inpatient Hospital Stay: Admitting: Genetic Counselor

## 2023-12-24 DIAGNOSIS — Z8049 Family history of malignant neoplasm of other genital organs: Secondary | ICD-10-CM

## 2023-12-24 DIAGNOSIS — Z8051 Family history of malignant neoplasm of kidney: Secondary | ICD-10-CM

## 2023-12-24 DIAGNOSIS — Z1509 Genetic susceptibility to other malignant neoplasm: Secondary | ICD-10-CM

## 2023-12-24 DIAGNOSIS — C50919 Malignant neoplasm of unspecified site of unspecified female breast: Secondary | ICD-10-CM

## 2023-12-24 DIAGNOSIS — Z803 Family history of malignant neoplasm of breast: Secondary | ICD-10-CM

## 2023-12-24 NOTE — Progress Notes (Signed)
 REFERRING PROVIDER: Alvia Bring, DO [address] [place],  [place] [address]  PRIMARY PROVIDER:  Alvia Bring, DO  PRIMARY REASON FOR VISIT:  1. MSH6 gene mutation positive   2. Malignant neoplasm of female breast, unspecified estrogen receptor status, unspecified laterality, unspecified site of breast (HCC)   3. Family history of malignant neoplasm of breast   4. Family history of malignant neoplasm of genital organ   5. Family history of malignant neoplasm of kidney      HISTORY OF PRESENT ILLNESS:   Ms. Timothy, a 73 y.o. female, was seen for a [place] cancer genetics consultation at the request of Alvia Bring, DO due to a personal and family history of cancer.  Ms. Goheen presents to clinic today to discuss the possibility of a hereditary predisposition to cancer, to discuss genetic testing, and to further clarify her future cancer risks, as well as potential cancer risks for family members.   CANCER HISTORY:  In 2022, at the age of 12, Ms. Buss was diagnosed with breast cancer of the right breast, with two primary breast cancers in the right breast. One was ER, PR, Her2+, while the other was ER, PR +, Her2-. She had a bilateral mastectomies and was treated with chemotherapy, radiation, and anastrozole. She sees her oncologist yearly.      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 Per [facility], these variants are most likely in cis (on the same copy of the gene) and have been reported to be in complete linkage disequilibrium. This haplotype (section of DNA or variants that are inherited together) has been reported in other individuals with MSH6-related cancers and when identified together are classified as likely pathogenic. There were no pathogenic or likely pathogenic variants detected in other  genes reported on in this study.    [facility] Tier One Population Screen is a screening test that analyzes 11 genes related to hereditary breast and ovarian cancer syndrome (HBOC), Lynch syndrome, and familial hypercholesterolemia. These include APOB, BRCA1, BRCA2, EPCAM, LDLR, LDLRAP1, PCSK9, PMS2 (excluding exons 11-15), MLH1, MSH2, MSH6. This test reports pathogenic and likely pathogenic variants, but does not report on variants of unknown significance. The absence of any additional pathogenic variants is reassuring, but does not rule out a hereditary condition. There are other variants and genes associated with heart disease, hereditary cancer and other inherited conditions that were not included in this test.  No history of colonoscopy, ovaries and uterus intact.   Past Medical History:  Diagnosis Date   A-fib (HCC)    Cancer (HCC) 08/2019   Diabetes mellitus without complication (HCC)    History of breast cancer    Hyperlipidemia    Hypertension    Stroke [facility])     Past Surgical History:  Procedure Laterality Date   BILATERAL TOTAL MASTECTOMY WITH AXILLARY LYMPH NODE DISSECTION  02/2020   BREAST SURGERY  03/2020   HERNIA REPAIR  2010   SMALL INTESTINE SURGERY  03/2016    Social History   Socioeconomic History   Marital status: Widowed    Spouse name: Not on file   Number of children: 3   Years of education: 14   Highest education level: Associate degree: academic program  Occupational History   Occupation: Retired    Comment: retired engineer, civil (consulting)  Tobacco Use   Smoking status: Former    Current packs/day:  0.50    Average packs/day: 0.5 packs/day for 46.4 years (23.2 ttl pk-yrs)    Types: Cigarettes    Start date: 09/13/1962    Quit date: 06/16/1982   Smokeless tobacco: Never  Vaping Use   Vaping status: Never Used  Substance and Sexual Activity   Alcohol use: Not Currently   Drug use: Not Currently    Types: Marijuana   Sexual activity: Not Currently    Partners: Female,  Female    Birth control/protection: Post-menopausal  Other Topics Concern   Not on file  Social History Narrative   Lives with her daughter and her family. She enjoys quilting and painting pillow cases.   Social Drivers of Health   Tobacco Use: Medium Risk (12/20/2023)   Patient History    Smoking Tobacco Use: Former    Smokeless Tobacco Use: Never    Passive Exposure: Not on file  Financial Resource Strain: Low Risk (08/08/2023)   Overall Financial Resource Strain (CARDIA)    Difficulty of Paying Living Expenses: Not hard at all  Food Insecurity: No Food Insecurity (08/08/2023)   Epic    Worried About Programme Researcher, Broadcasting/film/video in the Last Year: Never true    Ran Out of Food in the Last Year: Never true  Transportation Needs: No Transportation Needs (08/08/2023)   Epic    Lack of Transportation (Medical): No    Lack of Transportation (Non-Medical): No  Physical Activity: Sufficiently Active (08/08/2023)   Exercise Vital Sign    Days of Exercise per Week: 4 days    Minutes of Exercise per Session: 40 min  Stress: No Stress Concern Present (08/08/2023)   [employer] of Occupational Health - Occupational Stress Questionnaire    Feeling of Stress: Not at all  Social Connections: Moderately Integrated (08/08/2023)   Social Connection and Isolation Panel    Frequency of Communication with Friends and Family: More than three times a week    Frequency of Social Gatherings with Friends and Family: More than three times a week    Attends Religious Services: More than 4 times per year    Active Member of [employer] or Organizations: Yes    Attends Banker Meetings: More than 4 times per year    Marital Status: Widowed  Depression (PHQ2-9): Low Risk (08/08/2023)   Depression (PHQ2-9)    PHQ-2 Score: 0  Alcohol Screen: Low Risk (08/08/2023)   Alcohol Screen    Last Alcohol Screening Score (AUDIT): 0  Housing: Low Risk (08/08/2023)   Epic    Unable to Pay for Housing in the Last Year:  No    Number of Times Moved in the Last Year: 0    Homeless in the Last Year: No  Utilities: Not At Risk (08/08/2023)   Epic    Threatened with loss of utilities: No  Health Literacy: Adequate Health Literacy (08/08/2023)   B1300 Health Literacy    Frequency of need for help with medical instructions: Never     FAMILY HISTORY:  We obtained a detailed, 4-generation family history.  Significant diagnoses are listed below: Family History  Problem Relation Age of Onset   Hypertension Mother    Miscarriages / Stillbirths Mother    Varicose Veins Mother    Kidney cancer Father 16 - 67   Breast cancer Sister 35 - 17   Diabetes Brother    Stroke Maternal Grandfather    Obesity Paternal Grandmother    Uterine cancer Paternal Grandmother 45 - 76  Breast cancer Niece 78   Epilepsy Niece    Cancer Other        GI? paternal first cousin once removed   Cancer Paternal Cousin        kidney    Ms. Mcbreen is aware of previous family history of genetic testing, as her daughter also participated in the [employer] study and was found to carry the same MSH6 variants. There is no reported Ashkenazi Jewish ancestry. There is no known consanguinity. Patient reports race as Caucasian.       GENETIC COUNSELING ASSESSMENT: Ms. Seidenberg is a 73 y.o. female with a personal and family history of breast which is somewhat suggestive of a hereditary predisposition to cancer given her personal history of bilateral breast cancer with a family history of a first degree relative with breast cancer at 10 and a second degree relative with breast cancer under age 31. We, therefore, discussed and recommended the following at today's visit.   DISCUSSION: We discussed that 5 - 10% of cancer is hereditary, with most cases of breast associated with pathogenic variants in BRCA1/2.  There are other genes that can be associated with hereditary breast cancer syndromes that were not included in the [employer] study,  qualifying Ms. Chelf for further testing.  We discussed that testing is beneficial for several reasons including knowing how to follow individuals after completing their treatment, identifying whether potential treatment options would be beneficial, and understanding if other family members could be at risk for cancer and allowing them to undergo genetic testing.   We reviewed the characteristics, features and inheritance patterns of hereditary cancer syndromes. We also discussed genetic testing, including the appropriate family members to test, the process of testing, insurance coverage and turn-around-time for results. We discussed the implications of a negative, positive, carrier and/or variant of uncertain significant result. We recommended Ms. Shuman pursue genetic testing for a panel that includes genes associated with breast cancer.   Ms. Prohaska  was offered a common hereditary cancer panel (40+ genes) and an expanded pan-cancer panel (70+ genes). Ms. Brick was informed of the benefits and limitations of each panel, including that expanded pan-cancer panels contain genes that do not have clear management guidelines at this point in time.  We also discussed that as the number of genes included on a panel increases, the chances of variants of uncertain significance increases. After considering the benefits and limitations of each gene panel, Ms. Gonzalo elected to have [facility]'s MultiCancer panel. This panel evaluates 70 genes associated with hereditary cancer conditions that predispose to a variety of primarily adult-onset solid tumors across many organ systems including: breast, gynecologic (ovarian and uterine), colorectal, pancreatic, prostate, kidney, skin, brain and nervous system, and endocrine glands (adrenal, pituitary, parathyroid, thyroid ). The following genes are included in this analysis: AIP, ALK, APC, ATM, AXIN2, BAP1, BARD1, BLM, BMPR1A, BRCA1, BRCA2, BRIP1, CDC73, CDH1, CDK4, CDKN1B,  CDKN2A, CHEK2, CTNNA1, DICER1, EGFR, EPCAM, FH, FLCN, GREM1, HOXB13, KIT, LZTR1, MAX, MBD4, MEN1, MET, MITF, MLH1, MSH2, MSH3, MSH6, MUTYH, NF1, NF2, NTHL1, PALB2, PDGFRA, PMS2, POLD1, POLE, POT1, PRKAR1A, PTCH1, PTEN, RAD51C, RAD51D, RB1, RET, SDHA, SDHAF2, SDHB, SDHC, SDHD, SMAD4, SMARCA4, SMARCB1, SMARCE1, STK11, SUFU, TMEM127, TP53, TSC1, TSC2, VHL   Based on Ms. Kavanaugh's personal and family history of cancer, she meets medical criteria for genetic testing. Despite that she meets criteria, she may still have an out of pocket cost. We discussed that if her out of pocket cost for testing is over $100, the  laboratory should contact them to discuss self-pay prices, patient pay assistance programs, if applicable, and other billing options.We did review that testing will be order under [facility]'s requery or reuse program, utilizing the sample obtained for whole exome sequencing through the [place] research study.   We discussed that some people do not want to undergo genetic testing due to fear of genetic discrimination.  A federal law called the Genetic Information Non-Discrimination Act (GINA) of 2008 helps protect individuals against genetic discrimination based on their genetic test results.  It impacts both health insurance and employment.  With health insurance, it protects against increased premiums, being kicked off insurance or being forced to take a test in order to be insured.  For employment it protects against hiring, firing and promoting decisions based on genetic test results.  GINA does not apply to those in the [employer], those who work for companies with less than 15 employees, and new life insurance or long-term disability insurance policies.  Health status due to a cancer diagnosis is not protected under GINA.  PLAN: After considering the risks, benefits, and limitations, Ms. Herbst provided informed consent to pursue genetic testing and requery will be requested from [facility] for  analysis of the MultiCancer test. Results should be available within approximately 1-2 weeks' time, at which point they will be disclosed by myChart to Ms. Sadlon. We will follow up with her to review the result by phone and discuss any additional recommendations warranted by these results. Ms. Saxby will receive a summary of her genetic counseling visit and a copy of her results once available. This information will also be available in Epic.   Lastly, we encouraged Ms. Hilbert to remain in contact with cancer genetics annually so that we can continuously update the family history and inform her of any changes in cancer genetics and testing that may be of benefit for this family.   Ms. Kaseman questions were answered to her satisfaction today. Our contact information was provided should additional questions or concerns arise. Thank you for the referral and allowing us  to share in the care of your patient.   Burnard Ogren, MS, [facility] Licensed, [email or web address] phone: [phone]   14 minutes were spent on the date of the encounter in service to the patient including preparation, face-to-face consultation, documentation and care coordination.  The patient was seen alone.  Drs. Gudena and/or Lanny were available to discuss this case as needed.   I connected with  Ms. Inocencio on 12/24/2023 at 11:01 am EDT by telephone conference and verified that I am speaking with the correct person using three identifiers.   Patient location: home Provider location: [facility] [facility] _______________________________________________________________________ For Office Staff:  Number of people involved in session: 1 Was an Intern/ student involved with case: no

## 2023-12-26 ENCOUNTER — Ambulatory Visit (HOSPITAL_COMMUNITY): Admitting: Licensed Clinical Social Worker

## 2023-12-26 DIAGNOSIS — F411 Generalized anxiety disorder: Secondary | ICD-10-CM

## 2023-12-26 LAB — GENECONNECT MOLECULAR SCREEN: Genetic Analysis Overall Interpretation: POSITIVE — AB

## 2023-12-26 LAB — HELIX HEREDITARY MULTI-CANCER PANEL: Genetic Analysis Overall Interpretation: POSITIVE — AB

## 2023-12-26 NOTE — Progress Notes (Signed)
 THERAPIST PROGRESS NOTE  Session Time: 1:00 PM to 1:45 PM  Participation Level: Active  Behavioral Response: CasualAlertEuthymic  Type of Therapy: Individual Therapy  Treatment Goals addressed: Explore symptoms of anxiety how severe, coping-reviewed treatment goals patient gave consent in session to continue goals  ProgressTowards Goals: Progressing-talked about Christmas some of the things she is managing help patient to process to make good decisions she said in terms of continuing therapy she wants to see how the holidays are therapist encouraged her with making good decisions for herself  Interventions: Solution Focused, Strength-based, Supportive, and Other: coping  Summary: Jasmine Montgomery is a 73 y.o. female who presents with therapist appreciated her Christmas decor patient shared granddaughter help her put it together review of treatment goals and says wants to stay with therapy and see how the holidays go. Doesn't want to stay with younger son for the week. Doesn't want to go.  Therapist remembered they did not tell her to leave but told her daughter not to have her come back we reviewed the situation when she was there all three lied about her.  Therapist can understand not wanting to go back we did not feel welcome where people were telling lies.  Patient said probably said some things as well admit not perfect. Shelby 18 and Carson 13 and they don't want anything to do with her.  Therapist notes her grandmother was raised with her and one of the most important relationships did not realize it but are missing out.  Son and daughter-in-law more friends with them and in general overall can say they aren't kind. Patient says Renay is too much like like his Dad.  Wants to be at her daughter most a while wants to spend time with Chanda her grand daughter she is 8.  Therapist can totally understand that is where she is most of the time where she is has strong connections and where she has fun  enjoys herself.  Patient says love being with Albino and Arnaldo he wants to make everything better he will speak his mind he has told [name] do things to fix things between him and sister, Alana. [name] doesn't think anything need to fixed.  Alana has said he is toxic to my life and got to go.  Hard patient says because for Christmas wants to go and enjoy your time why is she the one has to cave in? He offer nothing in the way of spending time with her. They have done things haven't asked her to come one time.  Therapist wants her to take care of herself but that means figuring out where she wants to be has a right to be able to enjoy her Christmas.    Does everything together with Alana go shopping if go to dinner asks her want to go. [name] and [employer] wouldn't even tell her going. Alana helps patient if need help with something.  Therapist explored what [name] fixing things would look like start with Alana and with patient not treat like 2nd best. He calls every week faithful. Always nice conversation but come up meet her for dinner meet anywhere he won't do it.  Therapist had another would step it up told Leonor come meet her. Patient says wouldn't trade relationship with grandmother for anything. Mom's parents lived in [place]  started stay in [place]  for 3 months and had a great time with them. Both set of grandparents special relationship. Therapist in general validated patient how she was feeling also encouraging  her to make good choices for herself.   Suicidal/Homicidal: No ] Plan: Return again in 2 weeks.2.  Processed thoughts and feelings around current stressors provide strength-based supportive intervention  Diagnosis: Anxiety state  Collaboration of Care: Other none needed  Patient/Guardian was advised Release of Information must be obtained prior to any record release in order to collaborate their care with an outside provider. Patient/Guardian was advised if they have not already done so to  contact the registration department to sign all necessary forms in order for us  to release information regarding their care.   Consent: Patient/Guardian gives verbal consent for treatment and assignment of benefits for services provided during this visit. Patient/Guardian expressed understanding and agreed to proceed.   Ronal Sink, LCSW 12/26/2023

## 2024-01-01 DIAGNOSIS — Z1501 Genetic susceptibility to malignant neoplasm of breast: Secondary | ICD-10-CM

## 2024-01-01 NOTE — Progress Notes (Signed)
 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 Please refer to our prior cancer genetics clinic note for more information regarding our discussion, assessment and recommendations, at the time. Jasmine Montgomery recent genetic test results were disclosed to her, as were recommendations warranted by these results. These results and recommendations are discussed in more detail below.  Results were disclosed by telephone on 01/01/24.   CANCER HISTORY:  In 2022, at the age of 71, Jasmine Montgomery was diagnosed with breast cancer of the right breast, with two primary breast cancers in the right breast. One was ER, PR, Her2+, while the other was ER, PR +, Her2-. She had a bilateral mastectomies and was treated with chemotherapy, radiation, and anastrozole. She sees her oncologist yearly.    FAMILY HISTORY:  We obtained a detailed, 4-generation family history.  Significant diagnoses are listed below: Family History  Problem Relation Age of Onset   Hypertension Mother    Miscarriages / Stillbirths Mother    Varicose Veins Mother    Kidney cancer Father 47 - 14   Breast cancer Sister 67 - 53   Diabetes Brother    Stroke Maternal Grandfather    Obesity Paternal Grandmother    Uterine cancer Paternal Grandmother 52 - 75   Breast cancer Niece 52   Epilepsy Niece    Cancer Other        GI? paternal first cousin once removed   Cancer Paternal Cousin        kidney    Jasmine Montgomery is aware of previous family history of genetic testing, as her daughter also participated in the [employer] study and was found to carry the same MSH6 variants. There is no reported Ashkenazi Jewish ancestry. There is no known consanguinity. Patient reports race as Caucasian.      GENETIC TEST RESULTS: Genetic testing reported out on 12/26/23 through the  [facility] panel found no pathogenic mutations. This panel evaluates 70 genes associated with hereditary cancer conditions that predispose to a variety of primarily adult-onset solid tumors across many organ systems including: breast, gynecologic (ovarian and uterine), colorectal, pancreatic, prostate, kidney, skin, brain and nervous system, and endocrine glands (adrenal, pituitary, parathyroid, thyroid ). The following genes are included in this analysis: AIP, ALK, APC, ATM, AXIN2, BAP1, BARD1, BLM, BMPR1A, BRCA1, BRCA2, BRIP1, CDC73, CDH1, CDK4, CDKN1B, CDKN2A, CHEK2, CTNNA1, DICER1, EGFR, EPCAM, FH, FLCN, GREM1, HOXB13, KIT, LZTR1, MAX, MBD4, MEN1, MET, MITF, MLH1, MSH2, MSH3, MSH6, MUTYH, NF1, NF2, NTHL1, PALB2, PDGFRA, PMS2, POLD1, POLE, POT1, PRKAR1A, PTCH1, PTEN, RAD51C, RAD51D, RB1, RET, SDHA, SDHAF2, SDHB, SDHC, SDHD, SMAD4, SMARCA4, SMARCB1, SMARCE1, STK11, SUFU, TMEM127, TP53, TSC1, TSC2, VHL. A portion of the result report is included below for reference.     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 When observed together, these variants are classified as Likely Pathogenic. These two variants typically co-occur and are likely in cis (on the same copy of the gene).These results indicate a predisposition to, or diagnosis of, autosomal dominant Lynch syndrome. These variants were previously identified via the [employer] study. Genetic counseling provided on associated cancer risks, management, and inheritance. Please see genetic counseling note from 12/11/23 for further details on this result and recommendations.   We discussed with Jasmine Montgomery that because current genetic testing is not  perfect, it is possible there may be a gene mutation in one of these genes that current testing cannot detect, but that chance is small.  We also discussed, that there could be another gene that has not yet  been discovered, or that we have not yet tested, that is responsible for the cancer diagnoses in the family. It is also possible there is a hereditary cause for the cancer in the family that [name] did not inherit and therefore was not identified in her testing.  Therefore, it is important to remain in touch with cancer genetics in the future so that we can continue to offer Jasmine Montgomery the most up to date genetic testing.   ADDITIONAL GENETIC TESTING: We discussed with Jasmine Montgomery that her genetic testing was fairly extensive.  If there are genes identified to increase cancer risk that can be analyzed in the future, we would be happy to discuss and coordinate this testing at that time.    CANCER SCREENING RECOMMENDATIONS:  Recommended to follow NCCN guidelines for MSH6 likely pathogenic and pathogenic variants. Reviewed with Ms. Nie 12/11/23, no further questions at this time.   It is recommended she continue to follow the cancer management and screening guidelines provided by her oncology and primary healthcare provider. An individual's cancer risk and medical management are not determined by genetic test results alone. Overall cancer risk assessment incorporates additional factors, including personal medical history, family history, and any available genetic information that may result in a personalized plan for cancer prevention and surveillance  RECOMMENDATIONS FOR FAMILY MEMBERS:  Genetic testing for the MSH6 variants are recommended for Jasmine Montgomery's first degree relatives (parent, siblings, children), as they have a 1/2 or 50% chance to carry the same likely pathogenic variants.   Additionally, we recommend that women in the family are discussing the family history of breast cancer with their healthcare providers to determine if they would qualify for increased breast cancer surveillance.   FOLLOW-UP: Lastly, we discussed with Jasmine Montgomery that cancer genetics is a rapidly advancing field  and it is possible that new genetic tests will be appropriate for her and/or her family members in the future. We encouraged her to remain in contact with cancer genetics on an annual basis so we can update her personal and family histories and let her know of advances in cancer genetics that may benefit this family.   Our contact number was provided. Ms. Cinque questions were answered to her satisfaction, and she knows she is welcome to call us  at anytime with additional questions or concerns.   Burnard Ogren, MS, [facility] Licensed, [email or web address] [phone]

## 2024-01-04 ENCOUNTER — Other Ambulatory Visit: Payer: Self-pay | Admitting: Family Medicine

## 2024-01-07 ENCOUNTER — Ambulatory Visit: Payer: Self-pay | Admitting: Family Medicine

## 2024-01-07 LAB — COLOGUARD: COLOGUARD: NEGATIVE

## 2024-01-19 ENCOUNTER — Other Ambulatory Visit: Payer: Self-pay | Admitting: Family Medicine

## 2024-01-20 ENCOUNTER — Other Ambulatory Visit: Payer: Self-pay

## 2024-01-20 ENCOUNTER — Encounter: Payer: Self-pay | Admitting: *Deleted

## 2024-01-20 ENCOUNTER — Ambulatory Visit
Admission: EM | Admit: 2024-01-20 | Discharge: 2024-01-20 | Disposition: A | Discharge status: Home / Self Care | Attending: Family Medicine | Admitting: Family Medicine

## 2024-01-20 DIAGNOSIS — W540XXA Bitten by dog, initial encounter: Secondary | ICD-10-CM

## 2024-01-20 DIAGNOSIS — S91301A Unspecified open wound, right foot, initial encounter: Secondary | ICD-10-CM | POA: Diagnosis not present

## 2024-01-20 MED ORDER — AMOXICILLIN-POT CLAVULANATE 875-125 MG PO TABS: 1.0000 | ORAL_TABLET | Freq: Two times a day (BID) | ORAL | 0 refills | Status: AC

## 2024-01-20 NOTE — Discharge Instructions (Signed)
 Clean once a day and apply Band-Aid Take antibiotic 2 times a day May take Tylenol if needed for pain Call for problems

## 2024-01-20 NOTE — ED Provider Notes (Signed)
 " Jasmine Montgomery    CSN: [id number] Arrival date & time: 01/20/24  0907      History   Chief Complaint Chief Complaint  Patient presents with   Animal Bite    HPI Jasmine Montgomery is a 74 y.o. female.   HPI  Patient has a playful 60-month-old dog.  It was trying to remove her slipper, and accidentally bit her toe.  This happened yesterday.  It was bleeding.  Bleeding stopped with pressure.  Band-Aid is placed.  He has had vaccines for rabies.  Patient's tetanus is up-to-date.  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   Lower extremity edema 07/29/2023   GAD (generalized anxiety disorder) 07/05/2023   Essential tremor 02/26/2023   Memory change 02/26/2023   S/P ablation of atrial fibrillation 02/02/2023   Chronic cough 10/24/2022   Pre-syncope 04/01/2022   De Quervain's tenosynovitis, left 09/06/2021   Constipation 07/05/2020   Breast cancer (HCC) 04/04/2020   Hyperlipidemia associated with type 2 diabetes mellitus (HCC) 04/04/2020   S/P bilateral mastectomy 03/12/2020   Paroxysmal atrial fibrillation (HCC) 12/12/2019   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      Home Medications    Prior to Admission medications   Medication Sig Start Date End Date Taking? Authorizing Provider  amoxicillin -clavulanate (AUGMENTIN ) 875-125 MG tablet Take 1 tablet by mouth every 12 (twelve) hours. 01/20/24  Yes Maranda Jamee Jacob, MD  amiodarone (PACERONE) 200 MG tablet Take 200 mg by mouth daily. 11/26/22 09/06/23  [provider]  anastrozole (ARIMIDEX) 1 MG tablet Take 1 mg by mouth daily. 06/02/21   [provider]  apixaban  (ELIQUIS ) 5 MG TABS tablet Take 1 tablet by mouth twice daily 11/20/23   Alvia Bring, DO  Ascorbic Acid (VITAMIN C PO) Take by mouth.    [provider]  cetirizine  (ZYRTEC ) 10 MG tablet Take 1 tablet (10 mg total) by mouth daily. 10/01/23   Alvia Bring, DO  Cholecalciferol 25 MCG (1000 UT) tablet Take by mouth.    [provider]  clotrimazole -betamethasone  (LOTRISONE ) cream APPLY TOPICALLY TWICE DAILY 10/29/23   Alvia Bring, DO  furosemide  (LASIX ) 20 MG tablet Take 20mg  daily x7 days.  Then daily as needed for excess swelling. Patient taking differently: Take 20 mg by mouth. Take 20mg  daily x3 days.  Then daily as needed for excess swelling. 07/26/23   Alvia Bring, DO  ketoconazole (NIZORAL) 2 % cream Apply 1 Application topically daily. 10/16/22   [provider]  losartan -hydrochlorothiazide (HYZAAR) 50-12.5 MG tablet Take 1 tablet by mouth daily. 09/20/23   Alvia Bring, DO  metFORMIN  (GLUCOPHAGE ) 1000 MG tablet TAKE 1 TABLET BY MOUTH TWICE  DAILY WITH A MEAL. 01/04/24   Alvia Bring, DO  metoprolol  succinate (TOPROL -XL) 25 MG 24 hr tablet Take 1 tablet (25 mg total) by mouth daily. Take with or immediately following a meal. 08/11/22   Alvia Bring, DO  ondansetron  (ZOFRAN ) 4 MG tablet Take 4 mg by mouth every 8 (eight) hours as needed. 08/23/20   [provider]  pantoprazole  (PROTONIX ) 40 MG tablet Take 1 tablet by mouth once daily 12/10/23   Alvia Bring, DO  primidone  125 MG TABS TAKE 1 TABLET BY MOUTH AT BEDTIME. MAY INCREASE BY  1/2 TABLET EACH WEEK TO MAX DAILY DOSE OF 250mg  07/05/23   Alvia Bring, DO  RYBELSUS  7 MG TABS Take 1 tablet by mouth once daily 09/18/23   Alvia Bring, DO  Semaglutide , 1 MG/DOSE, 4 MG/3ML SOPN Inject 1 mg into the skin once a week. 10/15/23   Alvia Bring, DO  simvastatin  (ZOCOR ) 20 MG tablet Take 1 tablet by mouth once daily 11/06/23   Alvia Bring, DO  VENTOLIN  HFA 108 (90 Base) MCG/ACT inhaler INHALE 1 TO 2 PUFFS BY MOUTH EVERY 4 HOURS AS NEEDED FOR WHEEZING AND FOR SHORTNESS OF BREATH 12/17/23   Alvia Bring, DO  VITAMIN A PO Take by mouth.    [provider]  vitamin B-12 (CYANOCOBALAMIN ) 500 MCG tablet Take by mouth.    [provider]  VITAMIN E PO Take by mouth.    [provider]    Family History Family History  Problem Relation Age of Onset   Hypertension Mother    Miscarriages / Stillbirths Mother    Varicose Veins Mother    Kidney cancer Father 41 - 64   Breast cancer Sister 33 - 63   Diabetes Brother    Stroke Maternal Grandfather    Obesity Paternal Grandmother    Uterine cancer Paternal Grandmother 46 - 59   Breast cancer Niece 54   Epilepsy Niece    Cancer Other        GI? paternal first cousin once removed   Cancer Paternal Cousin        kidney    Social History Social History[1]   Allergies   Patient has no known allergies.   Review of Systems Review of Systems See HPI  Physical Exam Triage Vital Signs ED Triage Vitals  Encounter Vitals Group     BP 01/20/24 0947 132/78     Girls Systolic BP Percentile --      Girls Diastolic BP Percentile --      Boys Systolic BP Percentile --      Boys Diastolic BP Percentile --      Pulse Rate 01/20/24 0947 86     Resp 01/20/24 0947 20     Temp 01/20/24 0947 99.4 F (37.4 C)     Temp Source 01/20/24 0947 Oral     SpO2 01/20/24 0947 94 %     Weight 01/20/24 0943 216 lb 1.6 oz (98 kg)     Height --      Head Circumference --      Peak Flow --      Pain Score 01/20/24  0943 2     Pain Loc --      Pain Education --      Exclude from Growth Chart --    No data found.  Updated Vital Signs BP 132/78 (BP Location: Left Arm)   Pulse 86   Temp 99.4 F (37.4 C) (Oral)   Resp 20  Wt 98 kg   SpO2 94%   BMI 37.09 kg/m       Physical Exam Constitutional:      General: She is not in acute distress.    Appearance: She is well-developed.  HENT:     Head: Normocephalic and atraumatic.  Eyes:     Conjunctiva/sclera: Conjunctivae normal.     Pupils: Pupils are equal, round, and reactive to light.  Cardiovascular:     Rate and Rhythm: Normal rate.  Pulmonary:     Effort: Pulmonary effort is normal. No respiratory distress.  Musculoskeletal:        General: Signs of injury present. Normal range of motion.     Cervical back: Normal range of motion.       Feet:  Skin:    General: Skin is warm and dry.  Neurological:     Mental Status: She is alert.      UC Treatments / Results  Labs (all labs ordered are listed, but only abnormal results are displayed) Labs Reviewed - No data to display  EKG   Radiology No results found.  Procedures Procedures (including critical Montgomery time)  Medications Ordered in UC Medications - No data to display  Initial Impression / Assessment and Plan / UC Course  I have reviewed the triage vital signs and the nursing notes.  Pertinent labs & imaging results that were available during my Montgomery of the patient were reviewed by me and considered in my medical decision making (see chart for details).     Wound was cleaned.  Band-Aid placed.  No need for sutures.  Discussed home Montgomery. Final Clinical Impressions(s) / UC Diagnoses   Final diagnoses:  Dog bite, initial encounter  Wound of right foot     Discharge Instructions      Clean once a day and apply Band-Aid Take antibiotic 2 times a day May take Tylenol if needed for pain Call for problems   ED Prescriptions     Medication Sig Dispense  Auth. Provider   amoxicillin -clavulanate (AUGMENTIN ) 875-125 MG tablet Take 1 tablet by mouth every 12 (twelve) hours. 14 tablet Maranda Jamee Jacob, MD      PDMP not reviewed this encounter.    [1]  Social History Tobacco Use   Smoking status: Every Day    Current packs/day: 0.50    Average packs/day: 0.5 packs/day for 46.5 years (23.3 ttl pk-yrs)    Types: Cigarettes    Start date: 09/13/1962    Last attempt to quit: 06/16/1982   Smokeless tobacco: Never  Vaping Use   Vaping status: Never Used  Substance Use Topics   Alcohol use: Not Currently   Drug use: Not Currently    Types: Marijuana     Maranda Jamee Jacob, MD 01/20/24 1147  "

## 2024-01-20 NOTE — ED Triage Notes (Signed)
 Patient/daughter states patient's dog was playing and bit patient on right great toe, they applied butterfly stitches and band-aid patient is diabetic and daughter feels like wound is deep.  Dog belongs to family and up to date on vaccines.

## 2024-02-06 ENCOUNTER — Ambulatory Visit: Admitting: Family Medicine

## 2024-02-06 ENCOUNTER — Encounter: Payer: Self-pay | Admitting: Family Medicine

## 2024-02-06 VITALS — BP 132/79 | HR 87 | Ht 64.0 in | Wt 218.0 lb

## 2024-02-06 DIAGNOSIS — W540XXA Bitten by dog, initial encounter: Secondary | ICD-10-CM | POA: Diagnosis not present

## 2024-02-06 DIAGNOSIS — Z4802 Encounter for removal of sutures: Secondary | ICD-10-CM | POA: Diagnosis not present

## 2024-02-06 NOTE — Progress Notes (Signed)
 " Jasmine Montgomery - 74 y.o. female MRN [id number]  Date of birth: 12/10/1950  Subjective Chief Complaint  Patient presents with   Suture / Staple Removal    HPI Jasmine Montgomery is a 74 y.o. female here today for follow up of recent urgent care visits.  Seen at [facility] x2 after dog bite x2.  Had laceration repair or 2nd wound and placed on augmentin .  Updated Tdap given.  Reports that she is not having any pain around this area.  Denies fever or chills.  No drainage from the wound.  ROS:  A comprehensive ROS was completed and negative except as noted per HPI .  Allergies[1]  Past Medical History:  Diagnosis Date   A-fib (HCC)    Cancer (HCC) 08/2019   Diabetes mellitus without complication (HCC)    History of breast cancer    Hyperlipidemia    Hypertension    Stroke [facility])     Past Surgical History:  Procedure Laterality Date   BILATERAL TOTAL MASTECTOMY WITH AXILLARY LYMPH NODE DISSECTION  02/2020   BREAST SURGERY  03/2020   HERNIA REPAIR  2010   SMALL INTESTINE SURGERY  03/2016    Social History   Socioeconomic History   Marital status: Widowed    Spouse name: Not on file   Number of children: 3   Years of education: 14   Highest education level: Associate degree: academic program  Occupational History   Occupation: Retired    Comment: retired engineer, civil (consulting)  Tobacco Use   Smoking status: Every Day    Current packs/day: 0.50    Average packs/day: 0.5 packs/day for 46.6 years (23.3 ttl pk-yrs)    Types: Cigarettes    Start date: 09/13/1962    Last attempt to quit: 06/16/1982   Smokeless tobacco: Never  Vaping Use   Vaping status: Never Used  Substance and Sexual Activity   Alcohol use: Not Currently   Drug use: Not Currently    Types: Marijuana   Sexual activity: Not Currently    Partners: Female, Female    Birth control/protection: Post-menopausal  Other Topics Concern   Not on file  Social History Narrative   Lives with her daughter and her family. She enjoys quilting  and painting pillow cases.   Social Drivers of Health   Tobacco Use: High Risk (02/06/2024)   Patient History    Smoking Tobacco Use: Every Day    Smokeless Tobacco Use: Never    Passive Exposure: Not on file  Financial Resource Strain: Low Risk (08/08/2023)   Overall Financial Resource Strain (CARDIA)    Difficulty of Paying Living Expenses: Not hard at all  Food Insecurity: No Food Insecurity (08/08/2023)   Epic    Worried About Programme Researcher, Broadcasting/film/video in the Last Year: Never true    Ran Out of Food in the Last Year: Never true  Transportation Needs: No Transportation Needs (08/08/2023)   Epic    Lack of Transportation (Medical): No    Lack of Transportation (Non-Medical): No  Physical Activity: Sufficiently Active (08/08/2023)   Exercise Vital Sign    Days of Exercise per Week: 4 days    Minutes of Exercise per Session: 40 min  Stress: No Stress Concern Present (08/08/2023)   [employer] of Occupational Health - Occupational Stress Questionnaire    Feeling of Stress: Not at all  Social Connections: Moderately Integrated (08/08/2023)   Social Connection and Isolation Panel    Frequency of Communication with Friends and Family: More than  three times a week    Frequency of Social Gatherings with Friends and Family: More than three times a week    Attends Religious Services: More than 4 times per year    Active Member of Clubs or Organizations: Yes    Attends Banker Meetings: More than 4 times per year    Marital Status: Widowed  Depression (PHQ2-9): Low Risk (02/06/2024)   Depression (PHQ2-9)    PHQ-2 Score: 0  Alcohol Screen: Low Risk (08/08/2023)   Alcohol Screen    Last Alcohol Screening Score (AUDIT): 0  Housing: Low Risk (08/08/2023)   Epic    Unable to Pay for Housing in the Last Year: No    Number of Times Moved in the Last Year: 0    Homeless in the Last Year: No  Utilities: Not At Risk (08/08/2023)   Epic    Threatened with loss of utilities: No  Health  Literacy: Adequate Health Literacy (08/08/2023)   B1300 Health Literacy    Frequency of need for help with medical instructions: Never    Family History  Problem Relation Age of Onset   Hypertension Mother    Miscarriages / Stillbirths Mother    Varicose Veins Mother    Kidney cancer Father 84 - 74   Breast cancer Sister 71 - 86   Diabetes Brother    Stroke Maternal Grandfather    Obesity Paternal Grandmother    Uterine cancer Paternal Grandmother 34 - 58   Breast cancer Niece 64   Epilepsy Niece    Cancer Other        GI? paternal first cousin once removed   Cancer Paternal Cousin        kidney    Health Maintenance  Topic Date Due   Hepatitis C Screening  Never done   Zoster Vaccines- Shingrix  (1 of 2) Never done   Lung Cancer Screening  Never done   COVID-19 Vaccine (5 - 2025-26 season) 09/10/2023   Bone Density Scan  01/18/2024   Influenza Vaccine  04/08/2024 (Originally 08/10/2023)   Diabetic kidney evaluation - Urine ACR  04/18/2024   HEMOGLOBIN A1C  06/19/2024   Diabetic kidney evaluation - eGFR measurement  07/04/2024   Medicare Annual Wellness (AWV)  08/07/2024   OPHTHALMOLOGY EXAM  11/05/2024   FOOT EXAM  12/19/2024   Fecal DNA (Cologuard)  01/02/2027   DTaP/Tdap/Td (4 - Td or Tdap) 01/25/2034   Pneumococcal Vaccine: 50+ Years  Completed   Meningococcal B Vaccine  Aged Out   Mammogram  Discontinued     ----------------------------------------------------------------------------------------------------------------------------------------------------------------------------------------------------------------- Physical Exam BP 132/79   Pulse 87   Ht 5' 4 (1.626 m)   Wt 218 lb (98.9 kg)   SpO2 98%   BMI 37.42 kg/m   Physical Exam Constitutional:      Appearance: Normal appearance.  Eyes:     General: No scleral icterus. Skin:    Comments: Sutures from left leg removed without difficulty.  No signs of significant swelling or infection.  No drainage.   No dehiscence.  Steri-Strips applied to wound.  Neurological:     Mental Status: She is alert.     ------------------------------------------------------------------------------------------------------------------------------------------------------------------------------------------------------------------- Assessment and Plan  Dog bite Sutures removed today.  No signs of any new or continued infection.  Red flags reviewed.   No orders of the defined types were placed in this encounter.   No follow-ups on file.         [1] No Known Allergies  "

## 2024-02-06 NOTE — Assessment & Plan Note (Signed)
 Sutures removed today.  No signs of any new or continued infection.  Red flags reviewed.

## 2024-02-27 ENCOUNTER — Ambulatory Visit (HOSPITAL_COMMUNITY): Admitting: Licensed Clinical Social Worker

## 2024-06-19 ENCOUNTER — Ambulatory Visit: Admitting: Family Medicine

## 2024-08-12 ENCOUNTER — Ambulatory Visit
